# Patient Record
Sex: Male | Born: 1940 | Race: Black or African American | Hispanic: No | Marital: Single
Health system: Southern US, Community
[De-identification: ages and names within clinical notes are randomized; demographics above are authoritative.]

## PROBLEM LIST (undated history)

## (undated) DIAGNOSIS — I10 Essential (primary) hypertension: Secondary | ICD-10-CM

## (undated) DIAGNOSIS — K219 Gastro-esophageal reflux disease without esophagitis: Secondary | ICD-10-CM

## (undated) DIAGNOSIS — F411 Generalized anxiety disorder: Secondary | ICD-10-CM

## (undated) DIAGNOSIS — C61 Malignant neoplasm of prostate: Secondary | ICD-10-CM

## (undated) DIAGNOSIS — K5900 Constipation, unspecified: Secondary | ICD-10-CM

## (undated) DIAGNOSIS — E119 Type 2 diabetes mellitus without complications: Secondary | ICD-10-CM

## (undated) DIAGNOSIS — R338 Other retention of urine: Secondary | ICD-10-CM

## (undated) DIAGNOSIS — F329 Major depressive disorder, single episode, unspecified: Secondary | ICD-10-CM

## (undated) DIAGNOSIS — I2510 Atherosclerotic heart disease of native coronary artery without angina pectoris: Secondary | ICD-10-CM

## (undated) DIAGNOSIS — F0390 Unspecified dementia without behavioral disturbance: Secondary | ICD-10-CM

## (undated) DIAGNOSIS — N200 Calculus of kidney: Secondary | ICD-10-CM

## (undated) DIAGNOSIS — F32A Depression, unspecified: Secondary | ICD-10-CM

## (undated) HISTORY — PX: TRANSURETHRAL RESECTION OF PROSTATE: SHX73

---

## 2001-10-27 ENCOUNTER — Emergency Department (HOSPITAL_COMMUNITY): Admission: EM | Admit: 2001-10-27 | Discharge: 2001-10-27 | Payer: Self-pay | Admitting: Emergency Medicine

## 2001-10-27 ENCOUNTER — Encounter: Payer: Self-pay | Admitting: Emergency Medicine

## 2001-11-18 ENCOUNTER — Encounter: Payer: Self-pay | Admitting: Emergency Medicine

## 2001-11-18 ENCOUNTER — Emergency Department (HOSPITAL_COMMUNITY): Admission: EM | Admit: 2001-11-18 | Discharge: 2001-11-18 | Payer: Self-pay | Admitting: Emergency Medicine

## 2002-01-22 ENCOUNTER — Ambulatory Visit (HOSPITAL_COMMUNITY): Admission: RE | Admit: 2002-01-22 | Discharge: 2002-01-23 | Payer: Self-pay | Admitting: Ophthalmology

## 2002-04-13 ENCOUNTER — Emergency Department (HOSPITAL_COMMUNITY): Admission: EM | Admit: 2002-04-13 | Discharge: 2002-04-13 | Payer: Self-pay | Admitting: Emergency Medicine

## 2002-04-14 ENCOUNTER — Encounter: Payer: Self-pay | Admitting: Emergency Medicine

## 2002-04-14 ENCOUNTER — Inpatient Hospital Stay (HOSPITAL_COMMUNITY): Admission: EM | Admit: 2002-04-14 | Discharge: 2002-04-17 | Payer: Self-pay | Admitting: Emergency Medicine

## 2002-08-06 ENCOUNTER — Ambulatory Visit (HOSPITAL_COMMUNITY): Admission: RE | Admit: 2002-08-06 | Discharge: 2002-08-06 | Payer: Self-pay | Admitting: Internal Medicine

## 2002-08-06 ENCOUNTER — Encounter: Payer: Self-pay | Admitting: Internal Medicine

## 2004-11-25 ENCOUNTER — Inpatient Hospital Stay (HOSPITAL_COMMUNITY): Admission: AD | Admit: 2004-11-25 | Discharge: 2004-12-03 | Payer: Self-pay | Admitting: Internal Medicine

## 2005-03-21 ENCOUNTER — Ambulatory Visit (HOSPITAL_COMMUNITY): Admission: RE | Admit: 2005-03-21 | Discharge: 2005-03-21 | Payer: Self-pay | Admitting: Internal Medicine

## 2005-07-15 ENCOUNTER — Ambulatory Visit (HOSPITAL_COMMUNITY): Admission: RE | Admit: 2005-07-15 | Discharge: 2005-07-15 | Payer: Self-pay | Admitting: Internal Medicine

## 2005-07-21 ENCOUNTER — Emergency Department (HOSPITAL_COMMUNITY): Admission: EM | Admit: 2005-07-21 | Discharge: 2005-07-22 | Payer: Self-pay | Admitting: Emergency Medicine

## 2005-07-28 ENCOUNTER — Emergency Department (HOSPITAL_COMMUNITY): Admission: EM | Admit: 2005-07-28 | Discharge: 2005-07-29 | Payer: Self-pay | Admitting: Emergency Medicine

## 2005-09-12 ENCOUNTER — Emergency Department (HOSPITAL_COMMUNITY): Admission: EM | Admit: 2005-09-12 | Discharge: 2005-09-12 | Payer: Self-pay | Admitting: Emergency Medicine

## 2005-11-04 ENCOUNTER — Ambulatory Visit (HOSPITAL_COMMUNITY): Admission: RE | Admit: 2005-11-04 | Discharge: 2005-11-04 | Payer: Self-pay | Admitting: Urology

## 2006-08-17 ENCOUNTER — Ambulatory Visit (HOSPITAL_COMMUNITY): Admission: RE | Admit: 2006-08-17 | Discharge: 2006-08-17 | Payer: Self-pay | Admitting: Internal Medicine

## 2006-08-21 ENCOUNTER — Ambulatory Visit (HOSPITAL_COMMUNITY): Admission: RE | Admit: 2006-08-21 | Discharge: 2006-08-21 | Payer: Self-pay | Admitting: Internal Medicine

## 2006-12-07 ENCOUNTER — Emergency Department (HOSPITAL_COMMUNITY): Admission: EM | Admit: 2006-12-07 | Discharge: 2006-12-07 | Payer: Self-pay | Admitting: Emergency Medicine

## 2006-12-07 ENCOUNTER — Ambulatory Visit (HOSPITAL_COMMUNITY): Admission: RE | Admit: 2006-12-07 | Discharge: 2006-12-07 | Payer: Self-pay | Admitting: Internal Medicine

## 2006-12-13 ENCOUNTER — Ambulatory Visit: Payer: Self-pay | Admitting: Orthopedic Surgery

## 2006-12-25 ENCOUNTER — Ambulatory Visit: Payer: Self-pay | Admitting: Orthopedic Surgery

## 2007-01-04 ENCOUNTER — Ambulatory Visit: Payer: Self-pay | Admitting: Orthopedic Surgery

## 2007-02-05 ENCOUNTER — Ambulatory Visit: Payer: Self-pay | Admitting: Orthopedic Surgery

## 2007-02-05 DIAGNOSIS — S42309A Unspecified fracture of shaft of humerus, unspecified arm, initial encounter for closed fracture: Secondary | ICD-10-CM

## 2007-04-08 ENCOUNTER — Emergency Department (HOSPITAL_COMMUNITY): Admission: EM | Admit: 2007-04-08 | Discharge: 2007-04-08 | Payer: Self-pay | Admitting: Emergency Medicine

## 2007-04-19 ENCOUNTER — Ambulatory Visit: Payer: Self-pay | Admitting: Orthopedic Surgery

## 2007-04-19 DIAGNOSIS — S5290XA Unspecified fracture of unspecified forearm, initial encounter for closed fracture: Secondary | ICD-10-CM | POA: Insufficient documentation

## 2007-04-30 ENCOUNTER — Encounter: Payer: Self-pay | Admitting: Orthopedic Surgery

## 2007-05-04 ENCOUNTER — Encounter: Payer: Self-pay | Admitting: Orthopedic Surgery

## 2007-05-18 ENCOUNTER — Telehealth: Payer: Self-pay | Admitting: Orthopedic Surgery

## 2007-07-19 ENCOUNTER — Emergency Department (HOSPITAL_COMMUNITY): Admission: EM | Admit: 2007-07-19 | Discharge: 2007-07-19 | Payer: Self-pay | Admitting: Emergency Medicine

## 2010-03-08 ENCOUNTER — Emergency Department (HOSPITAL_COMMUNITY): Admission: EM | Admit: 2010-03-08 | Discharge: 2010-03-08 | Payer: Self-pay | Admitting: Emergency Medicine

## 2010-03-14 ENCOUNTER — Emergency Department (HOSPITAL_COMMUNITY)
Admission: EM | Admit: 2010-03-14 | Discharge: 2010-03-14 | Payer: Self-pay | Source: Home / Self Care | Admitting: Emergency Medicine

## 2010-03-18 ENCOUNTER — Emergency Department (HOSPITAL_COMMUNITY)
Admission: EM | Admit: 2010-03-18 | Discharge: 2010-03-18 | Payer: Self-pay | Source: Home / Self Care | Admitting: Emergency Medicine

## 2010-04-10 ENCOUNTER — Emergency Department (HOSPITAL_COMMUNITY)
Admission: EM | Admit: 2010-04-10 | Discharge: 2010-04-10 | Payer: Self-pay | Source: Home / Self Care | Admitting: Emergency Medicine

## 2010-04-20 ENCOUNTER — Encounter (INDEPENDENT_AMBULATORY_CARE_PROVIDER_SITE_OTHER): Payer: Self-pay | Admitting: Urology

## 2010-04-20 ENCOUNTER — Inpatient Hospital Stay (HOSPITAL_COMMUNITY)
Admission: RE | Admit: 2010-04-20 | Discharge: 2010-04-21 | Payer: Self-pay | Source: Home / Self Care | Attending: Urology | Admitting: Urology

## 2010-04-26 LAB — CBC
HCT: 39.4 % (ref 39.0–52.0)
HCT: 37.7 % — ABNORMAL LOW (ref 39.0–52.0)
Hemoglobin: 13 g/dL (ref 13.0–17.0)
Hemoglobin: 12.6 g/dL — ABNORMAL LOW (ref 13.0–17.0)
MCH: 31.6 pg (ref 26.0–34.0)
MCH: 31.6 pg (ref 26.0–34.0)
MCHC: 33 g/dL (ref 30.0–36.0)
MCHC: 33.4 g/dL (ref 30.0–36.0)
MCV: 95.6 fL (ref 78.0–100.0)
MCV: 94.5 fL (ref 78.0–100.0)
Platelets: 275 10*3/uL (ref 150–400)
Platelets: 257 10*3/uL (ref 150–400)
RBC: 4.12 MIL/uL — ABNORMAL LOW (ref 4.22–5.81)
RBC: 3.99 MIL/uL — ABNORMAL LOW (ref 4.22–5.81)
RDW: 14.4 % (ref 11.5–15.5)
RDW: 14.3 % (ref 11.5–15.5)
WBC: 7.1 10*3/uL (ref 4.0–10.5)
WBC: 11.5 10*3/uL — ABNORMAL HIGH (ref 4.0–10.5)

## 2010-04-26 LAB — BASIC METABOLIC PANEL
BUN: 16 mg/dL (ref 6–23)
BUN: 11 mg/dL (ref 6–23)
CO2: 29 mEq/L (ref 19–32)
CO2: 29 mEq/L (ref 19–32)
Calcium: 9.9 mg/dL (ref 8.4–10.5)
Calcium: 9 mg/dL (ref 8.4–10.5)
Chloride: 100 mEq/L (ref 96–112)
Chloride: 103 mEq/L (ref 96–112)
Creatinine, Ser: 1.12 mg/dL (ref 0.4–1.5)
Creatinine, Ser: 0.98 mg/dL (ref 0.4–1.5)
GFR calc Af Amer: >60 mL/min (ref >60)
GFR calc Af Amer: >60 mL/min (ref >60)
GFR calc non Af Amer: >60 mL/min (ref >60)
GFR calc non Af Amer: >60 mL/min (ref >60)
Glucose, Bld: 151 mg/dL — ABNORMAL HIGH (ref 70–99)
Glucose, Bld: 178 mg/dL — ABNORMAL HIGH (ref 70–99)
Potassium: 4.7 mEq/L (ref 3.5–5.1)
Potassium: 3.8 mEq/L (ref 3.5–5.1)
Sodium: 139 mEq/L (ref 135–145)
Sodium: 139 mEq/L (ref 135–145)

## 2010-04-26 LAB — DIFFERENTIAL
Basophils Absolute: 0 10*3/uL (ref 0.0–0.1)
Basophils Relative: 0 % (ref 0–1)
Eosinophils Absolute: 0.1 10*3/uL (ref 0.0–0.7)
Eosinophils Relative: 1 % (ref 0–5)
Lymphocytes Relative: 16 % (ref 12–46)
Lymphs Abs: 1.9 10*3/uL (ref 0.7–4.0)
Monocytes Absolute: 1.6 10*3/uL — ABNORMAL HIGH (ref 0.1–1.0)
Monocytes Relative: 14 % — ABNORMAL HIGH (ref 3–12)
Neutro Abs: 8 10*3/uL — ABNORMAL HIGH (ref 1.7–7.7)
Neutrophils Relative %: 69 % (ref 43–77)

## 2010-04-26 LAB — GLUCOSE, CAPILLARY
Glucose-Capillary: 92 mg/dL (ref 70–99)
Glucose-Capillary: 217 mg/dL — ABNORMAL HIGH (ref 70–99)
Glucose-Capillary: 186 mg/dL — ABNORMAL HIGH (ref 70–99)
Glucose-Capillary: 234 mg/dL — ABNORMAL HIGH (ref 70–99)
Glucose-Capillary: 255 mg/dL — ABNORMAL HIGH (ref 70–99)
Glucose-Capillary: 88 mg/dL (ref 70–99)

## 2010-04-26 LAB — SURGICAL PCR SCREEN: Staphylococcus aureus: POSITIVE — AB

## 2010-04-28 ENCOUNTER — Emergency Department (HOSPITAL_COMMUNITY)
Admission: EM | Admit: 2010-04-28 | Discharge: 2010-04-28 | Payer: Self-pay | Source: Home / Self Care | Admitting: Emergency Medicine

## 2010-04-30 ENCOUNTER — Inpatient Hospital Stay (HOSPITAL_COMMUNITY)
Admission: EM | Admit: 2010-04-30 | Discharge: 2010-05-06 | Disposition: A | Discharge status: Other Acute Inpatient Hospital | Payer: Self-pay | Source: Home / Self Care | Attending: Internal Medicine | Admitting: Internal Medicine

## 2010-05-03 LAB — CBC
HCT: 35.2 % — ABNORMAL LOW (ref 39.0–52.0)
MCH: 31.5 pg (ref 26.0–34.0)
MCV: 94.9 fL (ref 78.0–100.0)
RDW: 14.5 % (ref 11.5–15.5)
WBC: 10.4 10*3/uL (ref 4.0–10.5)

## 2010-05-03 LAB — CARDIAC PANEL(CRET KIN+CKTOT+MB+TROPI)
CK, MB: 1.1 ng/mL (ref 0.3–4.0)
Total CK: 63 U/L (ref 7–232)

## 2010-05-03 LAB — DIFFERENTIAL
Basophils Relative: 0 % (ref 0–1)
Eosinophils Relative: 4 % (ref 0–5)
Lymphs Abs: 1.8 10*3/uL (ref 0.7–4.0)
Monocytes Relative: 9 % (ref 3–12)
Neutro Abs: 7.3 10*3/uL (ref 1.7–7.7)

## 2010-05-03 LAB — POCT CARDIAC MARKERS: Myoglobin, poc: 85.4 ng/mL (ref 12–200)

## 2010-05-03 LAB — COMPREHENSIVE METABOLIC PANEL
Alkaline Phosphatase: 63 U/L (ref 39–117)
BUN: 15 mg/dL (ref 6–23)
Glucose, Bld: 229 mg/dL — ABNORMAL HIGH (ref 70–99)
Potassium: 4.5 mEq/L (ref 3.5–5.1)
Total Bilirubin: 0.2 mg/dL — ABNORMAL LOW (ref 0.3–1.2)
Total Protein: 6.8 g/dL (ref 6.0–8.3)

## 2010-05-03 NOTE — H&P (Signed)
[name], [name]            ACCOUNT NO.:  [id number]  MEDICAL RECORD NO.:  [id number]          PATIENT TYPE:  INP  LOCATION:  A335                          FACILITY:  [facility]  PHYSICIAN:  Garnett Rekowski D. Felecia Shelling, MD   DATE OF BIRTH:  05/02/1940  DATE OF ADMISSION:  04/30/2010 DATE OF DISCHARGE:  LH                             HISTORY & PHYSICAL   CHIEF COMPLAINT:  Chest pain.  HISTORY OF PRESENT ILLNESS:  This is a 70 year old male patient with history of multiple medical illnesses, who is currently a resident of local rest home, brought to emergency room due to chest pain.  He has been complaining of intermittent episode of chest pain for 2-3 days. His chest pain was intermittent and it was on midsternal area.  The patient was evaluated in the emergency room and his initial EKG and cardiac enzymes were negative for any acute myocardial ischemia.  His chest pain was relieved during the emergency room evaluation.  However, due to his multiple risk factors, the patient was admitted for further evaluation and monitoring.  REVIEW OF SYSTEMS:  The patient has multi-infarct dementia.  He is a very poor historian.  However, he has no fever or chills, nausea, vomiting, abdominal pain, dysuria, urgency, or frequency of urination.  PAST MEDICAL HISTORY: 1. Diabetes mellitus type 2. 2. History of multiple CVA with left-sided hemiplegia. 3. Diabetes neuropathy. 4. Anemia. 5. Hypertension. 6. Multi-infarct dementia. 7. History of GI bleed. 8. History of abscess and cellulitis of the left arm. 9. History of benign prostatic hypertrophy. 10.History of urinary retention secondary to the above.  CURRENT MEDICATIONS: 1. Actos 45 mg p.o. daily. 2. Alendronate 70 mg once weekly. 3. Xanax 0.25 mg at bedtime. 4. Amlodipine 10 mg daily. 5. Benazepril 40 mg daily. 6. Ciprofloxacin 250 mg b.i.d. 7. Ferrous sulfate 325 mg daily. 8. Neurontin 600 mg t.i.d. 9. Hydralazine 25 mg b.i.d. 10.Levemir  10 units b.i.d. 11.Metformin 1000 mg b.i.d. 12.NovoLog insulin sliding scale. 13.Paroxetine 40 mg daily. 14.Ranitidine 150 mg b.i.d. 15.Zocor 20 mg daily.  SOCIAL HISTORY:  The patient is currently a resident of assisted living. He has no history of recent alcohol, tobacco, or substance abuse.  The patient has been disabled due to his illness for several years.  FAMILY HISTORY:  This is not available as the patient has multi-infarct dementia and is not able to give any history.  PHYSICAL EXAMINATION:  GENERAL:  The patient is alert, awake, and chronically sick looking. VITAL SIGNS:  Blood pressure 172/81, pulse 97, respiratory rate 20, and temperature 99 degrees Fahrenheit. HEENT:  Pupils are equal and reactive. NECK:  Supple. CHEST:  Decreased air entry, few rhonchi. CARDIOVASCULAR SYSTEM:  First and second heart sounds heard.  No murmur, no gallop. ABDOMEN:  Soft and lax.  Bowel sounds positive.  No mass or organomegaly. EXTREMITIES:  No leg edema. NEUROLOGIC:  The patient is alert, awake, but confused and disoriented. The patient has left-sided hemiplegia.  LABS ON ADMISSION:  CMP, sodium 138, potassium 4.5, chloride 99, carbon dioxide 32, glucose 229, BUN 15, creatinine 1.3, and calcium 8.5.  CBC, WBC 10.4, hemoglobin 11.4, hematocrit 35.2,  and platelets 104-137.  D- dimer is 0.51, myoglobin 90, troponin less than 0.05, and CK-MB 1.0.  ASSESSMENT: 1. Chest pain to rule out acute myocardial ischemia. 2. Diabetes mellitus. 3. History of multiple cerebrovascular accident with left-sided     hemiplegia. 4. Multi-infarct dementia. 5. Neuropathy. 6. Hypertension. 7. History of acute urinary retention secondary to benign prostatic     hypertrophy. 8. History of anemia.  PLAN:  We will continue on telemetry and serial cardiac enzymes and EKG q.8 h. x3.  We will continue his regular medications.  We will continue supportive care.  And we will do an echocardiogram and  Cardiology consult.     Lamount Bankson D. Felecia Shelling, MD     TDF/MEDQ  D:  05/01/2010  T:  05/01/2010  Job:  [id number]  Electronically Signed by Avon Gully MD on 05/03/2010 08:37:15 AM

## 2010-05-04 LAB — URINE MICROSCOPIC-ADD ON

## 2010-05-04 LAB — BASIC METABOLIC PANEL
BUN: 16 mg/dL (ref 6–23)
CO2: 27 mEq/L (ref 19–32)
Calcium: 8.5 mg/dL (ref 8.4–10.5)
Creatinine, Ser: 1.02 mg/dL (ref 0.4–1.5)
GFR calc non Af Amer: >60 mL/min (ref >60)
Glucose, Bld: 136 mg/dL — ABNORMAL HIGH (ref 70–99)

## 2010-05-04 LAB — DIFFERENTIAL
Eosinophils Absolute: 0.4 10*3/uL (ref 0.0–0.7)
Eosinophils Relative: 4 % (ref 0–5)
Lymphs Abs: 1.9 10*3/uL (ref 0.7–4.0)
Monocytes Relative: 10 % (ref 3–12)
Neutrophils Relative %: 67 % (ref 43–77)

## 2010-05-04 LAB — GLUCOSE, CAPILLARY
Glucose-Capillary: 234 mg/dL — ABNORMAL HIGH (ref 70–99)
Glucose-Capillary: 147 mg/dL — ABNORMAL HIGH (ref 70–99)
Glucose-Capillary: 127 mg/dL — ABNORMAL HIGH (ref 70–99)
Glucose-Capillary: 145 mg/dL — ABNORMAL HIGH (ref 70–99)
Glucose-Capillary: 149 mg/dL — ABNORMAL HIGH (ref 70–99)
Glucose-Capillary: 112 mg/dL — ABNORMAL HIGH (ref 70–99)
Glucose-Capillary: 284 mg/dL — ABNORMAL HIGH (ref 70–99)

## 2010-05-04 LAB — CBC
HCT: 30.7 % — ABNORMAL LOW (ref 39.0–52.0)
MCH: 31.1 pg (ref 26.0–34.0)
MCV: 94.5 fL (ref 78.0–100.0)
Platelets: 365 10*3/uL (ref 150–400)
RBC: 3.25 MIL/uL — ABNORMAL LOW (ref 4.22–5.81)

## 2010-05-04 LAB — CARDIAC PANEL(CRET KIN+CKTOT+MB+TROPI): Total CK: 54 U/L (ref 7–232)

## 2010-05-04 LAB — URINALYSIS, ROUTINE W REFLEX MICROSCOPIC
Specific Gravity, Urine: 1.01 (ref 1.005–1.030)
Urine Glucose, Fasting: NEGATIVE mg/dL

## 2010-05-05 LAB — BASIC METABOLIC PANEL
BUN: 23 mg/dL (ref 6–23)
CO2: 26 mEq/L (ref 19–32)
Calcium: 9.3 mg/dL (ref 8.4–10.5)
Glucose, Bld: 130 mg/dL — ABNORMAL HIGH (ref 70–99)
Sodium: 140 mEq/L (ref 135–145)

## 2010-05-05 LAB — GLUCOSE, CAPILLARY
Glucose-Capillary: 76 mg/dL (ref 70–99)
Glucose-Capillary: 137 mg/dL — ABNORMAL HIGH (ref 70–99)
Glucose-Capillary: 145 mg/dL — ABNORMAL HIGH (ref 70–99)
Glucose-Capillary: 98 mg/dL (ref 70–99)

## 2010-05-05 LAB — LIPID PANEL
Cholesterol: 111 mg/dL (ref 0–200)
LDL Cholesterol: 68 mg/dL (ref 0–99)
Total CHOL/HDL Ratio: 3.8 RATIO
Triglycerides: 70 mg/dL (ref <150)

## 2010-05-06 NOTE — Op Note (Addendum)
  [name], [name]            ACCOUNT NO.:  [id number]  MEDICAL RECORD NO.:  [id number]          PATIENT TYPE:  INP  LOCATION:  A214                          FACILITY:  [facility]  PHYSICIAN:  Ky Barban, M.D.DATE OF BIRTH:  Aug 11, 1940  DATE OF PROCEDURE: DATE OF DISCHARGE:                              OPERATIVE REPORT   PREOPERATIVE DIAGNOSES: 1. Acute urinary retention. 2. Benign prostatic hypertrophy.  POSTOPERATIVE DIAGNOSES: 1. Acute urinary retention. 2. Benign prostatic hypertrophy.  PROCEDURE:  TUR prostate.  ANESTHESIA:  Spinal.  PROCEDURE IN DETAIL:  The patient under spinal anesthesia in lithotomy position, usual prep and drape, #28 Iglesias resectoscope was introduced into the bladder.  Prostatic urethra is completely obstructed with lateral lobe hypertrophy.  He has more tissue in the lateral lobe than what I suspected on the cystoscopy.  Bladder neck was circumferentially dissected down to the circular fibers.  Bleeders were coagulated. Resectoscope was pulled back at the level of the verumontanum, rotated to 11 o'clock position.  Resection of the right lobe was done between 11 and 7 o'clock position.  Similarly, the left lobe was resected between 1 and 5 o'clock position.  Next, the anterior midline tissue was resected. At the end, posterior midline tissue was resected along with the apical tissue with finger in the rectum to elevate the prostatic tissue.  The prostatic urethra looks wide open.  Chips were evacuated.  Bleeders were coagulated.  Resectoscope was removed, 22 three-way Foley catheter was left in for drainage.  CVA is clear.  The patient left the operating room in satisfactory condition.     Ky Barban, M.D.     MIJ/MEDQ  D:  04/20/2010  T:  04/21/2010  Job:  [id number]  Electronically Signed by Alleen Borne M.D. on 05/06/2010 04:53:39 PM

## 2010-05-06 NOTE — H&P (Addendum)
  [name], [name]            ACCOUNT NO.:  [id number]  MEDICAL RECORD NO.:  [id number]         PATIENT TYPE:  PAMB  LOCATION:  DAY                           FACILITY:  [facility]  PHYSICIAN:  Ky Barban, M.D.DATE OF BIRTH:  25-Jan-1941  DATE OF ADMISSION: DATE OF DISCHARGE:  LH                             HISTORY & PHYSICAL   CHIEF COMPLAINT:  Acute urinary retention.  HISTORY:  A 70 year old male who suffers from dementia unable to give me good history, but he is in urinary retention and could not do CMG. Cystoscopy shows that he has enlarged prostate with bladder neck obstruction.  I have advised him to undergo TUR prostate, then I had meeting with his family, told them that I cannot guarantee he may just have to live with this catheter, but he was voiding satisfactorily before this thing happened and I think he may be able to void after I do a TUR prostate, so they are willing to permit to try.  He is coming as outpatient to undergo TUR prostate, will be admitted in the hospital. He has been treated in the past with Avodart and Flomax and I am following him away from 2007.  He does have enlarged prostate and his other problems include he had history of CVA with the right hemiparesis, it happened twice over 10 years ago, also has hypertension, and insulin- dependent diabetes.  No history of have any surgeries.  PERSONAL HISTORY:  He does not smoke or drink.  FAMILY HISTORY:  No history of prostate cancer.  REVIEW OF SYSTEMS:  Unremarkable.  PHYSICAL EXAMINATION:  VITAL SIGNS:  Blood pressure is 120/80, temperature is normal, lying in the bed without any complaints. CENTRAL NERVOUS SYSTEM:  He has weakness of his right side. CHEST:  Clear. HEART:  Regular sinus rhythm. ABDOMEN:  Soft, flat.  Liver, spleen, and kidneys not palpable.  No CVA tenderness. EXTERNAL GENITALIA:  Circumcised, meatus adequate, has Foley catheter in place.  Testicles are normal. RECTAL:   Sphincter tone is normal.  No rectal mass.  Prostate 1/2+ smooth and firm.  IMPRESSION: 1. Benign prostatic hypertrophy, urinary retention. 2. Dementia.  PLAN:  TUR prostate under anesthesia as outpatient, then admit him in the hospital.     Ky Barban, M.D.     MIJ/MEDQ  D:  04/19/2010  T:  04/20/2010  Job:  [id number]  Electronically Signed by Alleen Borne M.D. on 05/06/2010 04:53:35 PM

## 2010-05-11 NOTE — Assessment & Plan Note (Signed)
Summary: XR HUMERUS/BSF    History of Present Illness: I saw [name] in the office today for a 1 month followup visit.  He is a 70 years old man with the complaint of:  fracture of right proximal humeral shaft. Patient states they he feels better.  fracture 12-05-06  now in fracture brace    Prior Medications :  None    Current Allergies (reviewed today): No known allergies  Updated/Current Medications (including changes made in today's visit):  * PREVACID 30 MG  * PLAVIX 75 MG  * PAXIL 30 MG  * NORVASC 10 MG  * HUMULIN 70/30  * TYLENOL 500 MG  * GLYCOLAX POWDER  * BENADRYL  * REGLAN 10 MG  * GLUCOPHAGE 1000 MG  * NEUROTIN 600  * LASIX 20 MG  * FLOMAX  * FERROUS SULFATE  * CARDURA  * DETROL LA  * LOTENSIN  * AVANDIA 4 MG  * AMBIEN 10 MG  * ASA 355 MG        Physical Exam  xrays today show thaat the frcature is healing in acceptable non anatomic alignment     Impression & Recommendations:  Problem # 1:  FX CLOSED HUMERUS SHAFT (ICD-812.21) Assessment: Improved  Orders: Post-Op Check (16109) Shoulder x-ray,  minimum 2 views (60454)    Patient Instructions: 1)  Please schedule a follow-up appointment in 2 months. 2)  xrays shoulder  3)  and humerus     ]

## 2010-05-17 NOTE — Consult Note (Addendum)
[name], [name]            ACCOUNT NO.:  [id number]  MEDICAL RECORD NO.:  [id number]          PATIENT TYPE:  INP  LOCATION:  A335                          FACILITY:  [facility]  PHYSICIAN:  Gerrit Friends. Dietrich Pates, MD, FACCDATE OF BIRTH:  03-Feb-1941  DATE OF CONSULTATION:  05/03/2010 DATE OF DISCHARGE:                                CONSULTATION   REASON FOR CONSULTATION:  Chest pain.  DATE OF CONSULTATION:  May 03, 2010.  HISTORY OF PRESENT ILLNESS:  This is a 70 year old African American male without prior documented history of CAD but multiple cardiovascular risk factors to include diabetes, hypertension, unknown lipid status who was recently admitted on April 20, 2010, for TURP in the setting of urinary retention and BPH.  The patient has dementia and is a difficult historian, but his sister who is at bedside is assisting with recent history.  Apparently, the patient has been having chest pain on and off times 1 week, and began to have diaphoresis and hypertension at [facility].  The patient states that the pain is squeezing pressure, coming and going.  His sister states that when she checks on him, he occasionally says that his chest is hurting and at other times it is not when she visited him most recently.    On the day of admission,the patient was very diaphoretic complaining of  chest pain.  The nursesthere stated that his blood pressure was very elevated  and therefore hewas sent [facility].  On ER evaluation, the patient's blood pressure was 154/68 with a heart rate of 85.  According to nursing home notes, he had a blood glucose of 335.  On ER evaluation, blood glucose was 229.  The patient has treated with subcutaneous insulin per sliding scale.  He continues on his p.o. metformin and Actos.  The patient is currently pain free and has had no recurrence of chest pain since admission.  REVIEW OF SYSTEMS:  Although limited  secondary to the patient's dementia, sweating, and chest pain which he describes as pressure and squeezing.  All other systems are reviewed and found to be negative with limitations as discussed code status is a full code.  PAST MEDICAL HISTORY:  Diabetes type 2, multiple CVAs with left-sided hemiplegia, diabetic neuropathy, anemia, hypertension, dementia, GI bleed, abscess, and cellulitis of the left arm.  BPH with urinary retention.  Hypercholesterolemia.  PAST SURGICAL HISTORY:  Status post TURP on April 20, 2010.  SOCIAL HISTORY:  He lives in [place] at [facility].  He is retired.  He does not smoke, drink, or use drugs.  FAMILY HISTORY:  Mother deceased with cerebral aneurysm.  Father deceased from "old age."  He has a sister who is in good health.  MEDICATIONS:  Current medications prior to admission, Actos 45 mg daily, alendronate 70 mg weekly, Xanax 0.25 mg at bedtime, amlodipine 10 mg daily, benazepril 40 mg daily, ciprofloxacin 250 mg b.i.d., ferrous sulfate daily, neurotomy 600 mg t.i.d., hydralazine 25 mg b.i.d., Levemir 10 units b.i.d., metformin 1000 mg b.i.d., NovoLog insulin per sliding scale, paroxetine 40 mg daily, ranitidine 150  mg b.i.d., and Zocor 20 mg daily.  ALLERGIES:  No known drug allergies.  LABORATORY DATA:  Hemoglobin 10.1, hematocrit 30.7, white blood cells 10.0, platelets 365.  Sodium 138, potassium 4.2, chloride 104, CO2 27, BUN 16, creatinine 1.0, glucose 136, D-dimer 0.51, troponin 0.03, 0.02, and 0.02 respectively.  Urinalysis positive for protein, blood, and yeast.  Chest x-ray dated April 30, 2010, bibasilar atelectasis with enlargement of cardiac silhouette.  EKG normal sinus rhythm with PVCs rate of 90 beats per minute with nonspecific inferior septal flattening.  PHYSICAL EXAMINATION:  VITAL SIGNS:  Blood pressure 118/76, pulse 58, respirations 18, temperature 97.5, current weight 79.4 kg (175 pounds), O2  sat 94% on room air. GENERAL:  He is sleeping but arouses but then falls asleep again.  He is answering questions clearly. HEENT:  Head is normocephalic and atraumatic. EYES:  PERRLA. NECK:  Supple without JVD, carotid bruits, or thyromegaly. CARDIOVASCULAR:  Regular rate and rhythm with 1/6 systolic murmur at the apex.  Pulses are 2+ and equal without bruits. LUNGS:  Clear to auscultation without wheezes, rales, or rhonchi. ABDOMEN:  Soft, nontender, 2+ bowel sounds. EXTREMITIES:  Without clubbing, cyanosis, or edema. MUSCULOSKELETAL:  No joint deformity. NEURO:  He has chronic right-sided weakness of his legs and arm.  He is slow to respond.  He is not obtunded but will fall asleep easily.  IMPRESSION: 1. Chest pain described as pressure, squeezing with associated     diaphoresis, hypertension, negative cardiac enzymes, negative EKG     for acute ischemia but he does have some nonspecific changes.  Plan     echocardiogram for left ventricular function, begin enteric-coated     aspirin in the setting of gastrointestinal bleed.  Sister does not     want invasive testing or intervention (i.e. cath or stents) once    echo was completed, can manage with medications.  We will continue     ACE inhibitor. 2. Insulin-dependent diabetes per PTH. 3. Hypertension well controlled at present, on benazepril, amlodipine.     We will follow this for medication adjustments if it is necessary. 4. Anemia, we would monitor this apparently this is chronic and has     been replaced by ferrous sulfate, assuming this is iron deficiency     anemia, although I do not have documented records for this.  PLAN:  This is a 70 year old African American male without prior documented CAD who presented to [facility] for recurrent chest pain and ongoing for a week with associated diaphoresis, hyperglycemia, and hypertension.  Our plan is to manage this patient medically as the sister at bedside does not want  any invasive testing completed.  Our plan will be to have echocardiogram completed and managed medically based upon his LV function.  We will continue to follow this patient throughout hospitalization making further recommendations.  On behalf of the physicians and providers of [facility], we would like to thank Dr. Felecia Shelling for allowing [place] to assist in the care of this patient.     Bettey Mare. Lyman Bishop, NP   ______________________________ Gerrit Friends. Dietrich Pates, MD, [facility]    KML/MEDQ  D:  05/03/2010  T:  05/03/2010  Job:  [id number]  cc:   Dr. Felecia Shelling  Electronically Signed by Joni Reining NP on 05/05/2010 03:55:48 PM Electronically Signed by [place] Bing MD [facility] on 05/17/2010 08:18:00 AM

## 2010-05-26 NOTE — Discharge Summary (Signed)
NAMEDEMANI, MCBRIEN            ACCOUNT NO.:  [id number]  MEDICAL RECORD NO.:  [id number]          PATIENT TYPE:  LOCATION:                                 FACILITY:  PHYSICIAN:  Andra Matsuo D. Felecia Shelling, MD        DATE OF BIRTH:  DATE OF ADMISSION:  04/30/2010 DATE OF DISCHARGE:  01/25/2012LH                              DISCHARGE SUMMARY   DISCHARGE DIAGNOSES: 1. Chest pain. 2. Diabetes mellitus. 3. History of multiple cerebrovascular accidents. 4. Multi-infarct dementia. 5. History of coronary artery disease. 6  Diabetic neuropathy. 1. Anemia. 2. History of gastrointestinal bleed. 3. History of cellulitis of the left arm. 4. History of benign prostatic hypertrophy. 5. History of acute urinary tract infection due to bladder outlet     obstruction. 6. Status post transurethral prostatectomy. 7. Hyperlipidemia.  CURRENT MEDICATIONS: 1. Accu-Chek with sliding scale a.c. and at bedtime. 2. Actos 45 mg daily. 3. Xanax 0.25 mg at bedtime. 4. Norvasc 10 mg daily. 5. Lotensin 14 mg daily. 6. Ferrous sulfate 325 mg daily. 7. Gabapentin 300 mg 3 times a day. 8. Hydralazine 25 mg twice a day. 9. Lantus insulin 10 units subcu twice a day. 10.Loxapine 10 mg at bedtime. 11.Glucophage 500 mg twice a day. 12.Paxil 40 mg daily. 13.Pepcid 20 mg twice a day. 14.Zocor 20 mg daily. 15.Clonidine 0.1 mg twice a day. 16.Ciprofloxacin 500 mg p.o. twice a day for 3 more days. 17.Metoprolol succinate 25 mg daily. 18.Aspirin 81 mg daily. 19.Nitroglycerin 0.4 mg sublingual p.r.n. for chest pain ever 5     minutes times 3.  DISPOSITION:  The patient will be discharged to a nursing home in stable condition.  DISCHARGE INSTRUCTIONS:  The patient will be discharged with a Foley catheter.  He will need follow-up with his urologist, Dr. Onalee Hua.  And the patient will continue oral antibiotics for 3 more days.  LABS ON DISCHARGE:  CBC: WBC 10.0, hemoglobin 10.1, hematocrit 30.7 and platelets 365.   BMP:  Sodium 140, potassium 4.2, chloride 106, carbon dioxide 26, glucose 130, BUN 23, creatinine 1.0, calcium 9.1.  Lipid panel:  Cholesterol 111, triglycerides 70, LDH 29, HDL 68.  HOSPITAL COURSE:  This is a 70 year old male patient with history of multiple medical illnesses including multi-infarct dementia.  The patient was a resident of a local assisted living.  He was brought to the emergency room due to chest pain.  Patient is a very poor historian due to his multiple medical illnesses.  He was unable to give detailed history.  However, he was admitted as a case of chest pain to rule out myocardial infarction.  Serial EKGs and cardiac enzymes were negative. The patient was seen by cardiologist and advised to continue on medical treatment.  The patient has a history of recent prostate surgery due to acute urinary retention.  He has indwelling Foley catheter.  The patient is going to be discharged due to his Foley catheter.  He needs follow-up with his urologist to decide when to take out the Foley catheter.  The patient will continue his regular treatment.     Shenekia Riess D. Felecia Shelling, MD  TDF/MEDQ  D:  05/05/2010  T:  05/05/2010  Job:  [id number]  Electronically Signed by Avon Gully MD on 05/26/2010 08:18:23 AM

## 2010-06-07 NOTE — Discharge Summary (Signed)
  [name], [name]            ACCOUNT NO.:  [id number]  MEDICAL RECORD NO.:  [id number]          PATIENT TYPE:  INP  LOCATION:  A214                          FACILITY:  [facility]  PHYSICIAN:  Ky Barban, M.D.DATE OF BIRTH:  Apr 21, 1940  DATE OF ADMISSION:  04/20/2010 DATE OF DISCHARGE:  01/11/2012LH                              DISCHARGE SUMMARY   A 70 year old gentleman who has multiple medical problems including dementia and not able to give me very good history.  He is in urinary retention.  We could not do a systematic because he is not able to tell me anything.  Cystoscopy was done in the office, showed enlarged prostate with bladder neck obstruction, so I decided to do a TUR of prostate.  I have told the family that he may not be fairly able to void, but we need to try to do this thing.  He was brought as outpatient after having routine admission workup, which was satisfactory.  He was taken to the operating room, TUR of prostate is done.  Postoperatively next day, his urine is basically clear.  It was slightly pinkish, so I decided to send him home with Foley catheter, which I will take it out in the office next week.  His final pathology report is still pending and I will find out when he comes back to the office for follow up.  FINAL DISCHARGE DIAGNOSES: 1. Benign prostatic hypertrophy with bladder neck obstruction. 2. Dementia.  DISCHARGE CONDITION:  Improved.  DISCHARGE MEDICATIONS:  He is advised to continue his usual medicine, I do not need to give him anything.  Report to the office in 1 week.     Ky Barban, M.D.     MIJ/MEDQ  D:  06/02/2010  T:  06/03/2010  Job:  [id number]  Electronically Signed by Alleen Borne M.D. on 06/07/2010 09:36:11 AM

## 2010-06-21 LAB — URINE CULTURE
Colony Count: 75000
Colony Count: 3000
Colony Count: NO GROWTH
Culture: NO GROWTH

## 2010-06-21 LAB — CBC
HCT: 34.7 % — ABNORMAL LOW (ref 39.0–52.0)
Hemoglobin: 11.9 g/dL — ABNORMAL LOW (ref 13.0–17.0)
MCHC: 33.3 g/dL (ref 30.0–36.0)
Platelets: 314 10*3/uL (ref 150–400)
RDW: 13.8 % (ref 11.5–15.5)
RDW: 13.9 % (ref 11.5–15.5)
WBC: 7 10*3/uL (ref 4.0–10.5)

## 2010-06-21 LAB — URINALYSIS, ROUTINE W REFLEX MICROSCOPIC
Bilirubin Urine: NEGATIVE
Glucose, UA: 100 mg/dL — AB
Ketones, ur: 15 mg/dL — AB
Nitrite: POSITIVE — AB
Protein, ur: 30 mg/dL — AB
Protein, ur: >300 mg/dL — AB
Specific Gravity, Urine: 1.025 (ref 1.005–1.030)
Urobilinogen, UA: 0.2 mg/dL (ref 0.0–1.0)
Urobilinogen, UA: 1 mg/dL (ref 0.0–1.0)

## 2010-06-21 LAB — DIFFERENTIAL
Basophils Absolute: 0 10*3/uL (ref 0.0–0.1)
Basophils Absolute: 0 10*3/uL (ref 0.0–0.1)
Basophils Relative: 1 % (ref 0–1)
Lymphocytes Relative: 17 % (ref 12–46)
Monocytes Absolute: 1 10*3/uL (ref 0.1–1.0)
Neutro Abs: 4.8 10*3/uL (ref 1.7–7.7)
Neutro Abs: 6 10*3/uL (ref 1.7–7.7)
Neutrophils Relative %: 68 % (ref 43–77)
Neutrophils Relative %: 71 % (ref 43–77)

## 2010-06-21 LAB — BASIC METABOLIC PANEL
BUN: 15 mg/dL (ref 6–23)
BUN: 17 mg/dL (ref 6–23)
Calcium: 9.4 mg/dL (ref 8.4–10.5)
Calcium: 9.8 mg/dL (ref 8.4–10.5)
GFR calc non Af Amer: 51 mL/min — ABNORMAL LOW (ref >60)
GFR calc non Af Amer: >60 mL/min (ref >60)
Glucose, Bld: 110 mg/dL — ABNORMAL HIGH (ref 70–99)
Potassium: 4.2 mEq/L (ref 3.5–5.1)
Sodium: 137 mEq/L (ref 135–145)
Sodium: 141 mEq/L (ref 135–145)

## 2010-06-21 LAB — URINE MICROSCOPIC-ADD ON

## 2010-06-22 LAB — URINALYSIS, ROUTINE W REFLEX MICROSCOPIC
Bilirubin Urine: NEGATIVE
Leukocytes, UA: NEGATIVE
Nitrite: NEGATIVE
Specific Gravity, Urine: 1.01 (ref 1.005–1.030)
Urobilinogen, UA: 0.2 mg/dL (ref 0.0–1.0)
pH: 6.5 (ref 5.0–8.0)

## 2010-06-22 LAB — URINE MICROSCOPIC-ADD ON

## 2010-06-22 LAB — DIFFERENTIAL
Eosinophils Absolute: 0 10*3/uL (ref 0.0–0.7)
Eosinophils Relative: 0 % (ref 0–5)
Lymphocytes Relative: 7 % — ABNORMAL LOW (ref 12–46)
Lymphs Abs: 0.7 10*3/uL (ref 0.7–4.0)
Monocytes Relative: 7 % (ref 3–12)

## 2010-06-22 LAB — BASIC METABOLIC PANEL
CO2: 26 mEq/L (ref 19–32)
Chloride: 106 mEq/L (ref 96–112)
GFR calc Af Amer: >60 mL/min (ref >60)
Potassium: 4.4 mEq/L (ref 3.5–5.1)
Sodium: 141 mEq/L (ref 135–145)

## 2010-06-22 LAB — URINE CULTURE

## 2010-06-22 LAB — CBC
Hemoglobin: 11.8 g/dL — ABNORMAL LOW (ref 13.0–17.0)
MCH: 31.8 pg (ref 26.0–34.0)
MCV: 96 fL (ref 78.0–100.0)
Platelets: 276 10*3/uL (ref 150–400)
RBC: 3.71 MIL/uL — ABNORMAL LOW (ref 4.22–5.81)
WBC: 11 10*3/uL — ABNORMAL HIGH (ref 4.0–10.5)

## 2010-08-27 NOTE — H&P (Signed)
[name], [name]                        ACCOUNT NO.:  [id number]   MEDICAL RECORD NO.:  [id number]                   PATIENT TYPE:  INP   LOCATION:  A326                                 FACILITY:  [facility]   PHYSICIAN:  Tesfaye D. Felecia Shelling, M.D.              DATE OF BIRTH:  02-Sep-1940   DATE OF ADMISSION:  04/14/2002  DATE OF DISCHARGE:                                HISTORY & PHYSICAL   CHIEF COMPLAINT:  Lower abdominal pain and tarry stool.   HISTORY OF PRESENT ILLNESS:  This is a 70 year old male patient with  multiple medical illnesses from [facility], brought to the  emergency room with above complaint.  The patient claims he had intermittent  lower abdominal pain and tarry stools since two days.  He was brought to the  emergency room yesterday and was found to have a tarry stool which was heme-  positive and had hemoglobin of 10.1 and hematocrit of 31.5.  The patient was  evaluated and he was advised for admission.  However, the patient decided to  go back to the nursing home.  This morning, his symptoms continued to  persist and he decided to come back to the emergency room.  When he was  reevaluated, he was found to have persistent tarry stool with a hemoglobin  of 10.6 and hematocrit of 33.  The patient was hemodynamically otherwise  negative.  He had no nausea, vomiting, dysuria, urgency or frequency of  urination.  The patient was then admitted for further evaluation and  treatment.  The patient is also noted to be on aspirin and Plavix.   PAST MEDICAL HISTORY:  1. Diabetes mellitus, type 2.  2. Hypertension.  3. History of multiple CVAs with right-side hemiplegia.  4. Anemia.  5. Diabetic retinopathy.   CURRENT MEDICATIONS:  1. Aspirin 325 mg p.o. every day.  2. Plavix 75 mg p.o. every day.  3. Prevacid 30 mg p.o. every day.  4. Colace 100 mg p.o. b.i.d.  5. Neurontin 600 mg p.o. t.i.d.  6. Glucophage 1000 mg p.o. b.i.d.  7. Lotensin 40 mg p.o. every  day.  8. Humulin insulin 70/30 -- 80 units subcu q.p.m.  9. Glucotrol XL 10 mg p.o. every day.  10.      Lasix 20 mg p.o. every day.  11.      Cardura 1 mg p.o. every day.  12.      Avandia 4 mg p.o. every day.  13.      Ambien 10 mg p.o. q.h.s.  14.      Norvasc 10 mg p.o. every day.  15.      Paxil 30 mg p.o. every day.  16.      Tylenol 500 mg two tablets p.o. every day.   PERSONAL AND SOCIAL HISTORY:  The patient is a resident of [facility]  He is disabled due to  his illness.  He is single.  No history of  recent alcohol, tobacco or substance abuse.   PHYSICAL EXAMINATION:  GENERAL:  The patient is alert, awake and chronically  sick-looking.  VITALS:  Pulse 88, 130/80, respiratory rate 16, temperature 98 degrees  Fahrenheit.  HEENT:  Pupils are equal and reactive.  NECK:  Neck is supple.  CHEST:  Clear lung fields.  Good air entry.  CARDIOVASCULAR:  First and second heart sounds heard.  No murmur.  No  gallop.  ABDOMEN:  Abdomen is soft and relaxed.  Bowel sounds are positive.  No mass.  No organomegaly.  EXTREMITIES:  No leg edema.  NEUROLOGICAL:  The patient has slurred speech.  He had right-sided  hemiplegia.  Otherwise, he is alert, awake and oriented to 3.   LABORATORY DATA:  WBC 7.6, hemoglobin 10.6, hematocrit 33.6 and platelets  423,000; repeat hemoglobin was 9.1 and hematocrit was 28.6.  Sodium 138,  potassium 4.0, chloride 102, glucose 84, CO2 33, BUN 11, creatinine 0.9,  calcium 9.0, amylase 131 and lipase 31.   ASSESSMENT:  1. Gastrointestinal bleed.  2. Anemia secondary to the above.  3. Diabetes mellitus.  4. Cerebrovascular accident with right hemiplegia.  5. Hypertension.  6. Diabetic retinopathy and neuropathy.   PLAN:  We will do serial hemoglobin and hematocrits.  We will start the  patient on Protonix 40 mg IV push.  We will do GI consult.  We will hold his  Plavix and aspirin and we will continue his regular medications.                                                Tesfaye D. Felecia Shelling, M.D.    TDF/MEDQ  D:  04/14/2002  T:  04/15/2002  Job:  [id number]

## 2010-08-27 NOTE — Discharge Summary (Signed)
   NAMELEOBARDO, GRANLUND                        ACCOUNT NO.:  [id number]   MEDICAL RECORD NO.:  [id number]                   PATIENT TYPE:  INP   LOCATION:  A326                                 FACILITY:  [facility]   PHYSICIAN:  Tesfaye D. Felecia Shelling, M.D.              DATE OF BIRTH:  May 29, 1940   DATE OF ADMISSION:  04/14/2002  DATE OF DISCHARGE:  04/17/2002                                 DISCHARGE SUMMARY   DISCHARGE DIAGNOSES:  1. Gastrointestinal bleed, probably from erosion of small bowel.  2. Anemia secondary to the above.  3. Diabetes mellitus.  4. Hypertension.  5. Status post cerebrovascular accident.  6. Diabetic retinopathy and neuropathy.   DISPOSITION:  The patient was discharged to rest home in stable condition.   HOSPITAL COURSE:  This is a 70 year old male patient with a history of  multiple medical illnesses, admitted from [facility].  The patient  had a lower GI bleed with tarry stool.  There was a drop in his hemoglobin  and hematocrit.  He was evaluated by GI and had a colonoscopy.  The patient  was found to have very dry stool with severe sign of constipation.  There  was no obvious site of acute bleeding.  Probably his bleeding was from  erosion of the intestine due to very dry stool.  Over the hospital stay the  patient improved, and he was discharged back to the rest home with stool  softeners and regular treatment.                                               Tesfaye D. Felecia Shelling, M.D.    TDF/MEDQ  D:  05/03/2002  T:  05/04/2002  Job:  [id number]

## 2010-08-27 NOTE — H&P (Signed)
NAMEDELMAR, ARRIAGA            ACCOUNT NO.:  [id number]   MEDICAL RECORD NO.:  [id number]          PATIENT TYPE:  INP   LOCATION:  A310                          FACILITY:  [facility]   PHYSICIAN:  Tesfaye D. Felecia Shelling, MD   DATE OF BIRTH:  Jul 08, 1940   DATE OF ADMISSION:  11/25/2004  DATE OF DISCHARGE:  LH                                HISTORY & PHYSICAL   CHIEF COMPLAINT:  Pain and swelling of the left arm.   HISTORY OF PRESENT ILLNESS:  This is a 70 year old male patient with history  of multiple illnesses who is a resident of [facility] brought to  the office with above complaint.  The patient had gradually worsening pain  for the last 1 week on his left arm.  Her started noticing swelling in the  last 3 days.  The patient also developed some pus and discharge from the  swelling.  He had a low-grade fever.  The patient was then brought to the  office where he was evaluated and was admitted with a case of cellulitis and  abscess of the left arm.   REVIEW OF SYSTEMS:  CONSTITUTIONAL:  Intermittent headache and low-grade  fever.  GASTROINTESTINAL:  No nausea, vomiting, abdominal pain, dysuria,  urgency or frequency of urination.  CARDIOPULMONARY:  No cough, chest pain,  palpitations or shortness of breath.   PAST MEDICAL HISTORY:  1.  Status post CVA with left-sided hemiplegia.  2.  Diabetes mellitus.  3.  Diabetic neuropathy.  4.  Anemia.  5.  Hypertension.  6.  History of GI bleed.   CURRENT MEDICATIONS:  1.  Lortab 5/500 one tablet p.o. q.6h. p.r.n.  2.  Ambien 10 mg p.o. nightly.  3.  Paroxetine 30 mg p.o. daily.  4.  Plavix 75 mg p.o. daily.  5.  Prevacid 30 mg p.o. daily.  6.  Flomax 0.4 mg p.o. daily.  7.  Aspirin 325 mg p.o. daily.  8.  Avandia 4 mg p.o. daily.  9.  MiraLax 17 g p.o. daily.  10. Humulin insulin 70/30 18 units subcu nightly.  11. Glucophage 1000 mg p.o. b.i.d.  12. Reglan 5 mg p.o. a.c. and nightly.  13. Norvasc p.o. daily.  14. Lotensin  40 mg p.o. daily.  15. Doxazosin 1 mg p.o. daily.  16. Ferrous sulfate 325 mg p.o. daily.  17. Furosemide 20 mg p.o. daily.   SOCIAL HISTORY:  The patient is current resident of [facility].  He is disabled due to his illness.  The patient has no history of recent  tobacco, alcohol or substance abuse.   PHYSICAL EXAMINATION:  GENERAL:  The patient is alert and awake, acute sick-  looking.  VITAL SIGNS:  Blood pressure 130/80, pulse 88, respirations 16, temperature  99 degrees Fahrenheit.  HEENT:  Pupils are equal and reactive.  NECK:  Supple.  CHEST:  Clear lung fields.  Good air entry.  CARDIAC:  S1, S2 heard.  No murmur or gallop.  ABDOMEN:  Soft, flat.  Normoactive bowel sounds.  No masses or organomegaly.  EXTREMITIES:  No leg edema.  The patient has left-sided hemiplegia.  There  is diffuse swelling of the right arm around his deltoid muscle.  There is  also diffuse redness involving all over the right arm.  It is tender and  hot.  There is fluctuation and some pus with discharge.   LABORATORY DATA AND X-RAY FINDINGS:  WBC 13.4, hemoglobin 11.2, hematocrit  33.7, platelets 280.  Sodium 137, potassium 3.9, chloride 102, carbon  dioxide 28, glucose 219, BUN 14, creatinine 1.0.  Bilirubin 0.6, Alk phos  53, AST 19, ALT 11, total protein 6.0, albumin 3.0, calcium 8.5.   ASSESSMENT:  1.  Left arm cellulitis and abscess.  The patient has no apparent history of      trauma.  2.  Diabetes mellitus.  3.  Status post cerebrovascular accident with left-sided hemiplegia.  4.  History of anemia.  5.  Diabetic neuropathy.  6.  Hypertension.  7.  History of gastrointestinal bleed.   PLAN:  1.  We will start the patient empirically on combination of clindamycin and      vancomycin after obtaining blood culture.  2.  We will do also x-ray of the left humerus to rule out osteomyelitis.  3.  Surgical consult for possible incision and drainage.  4.  Get baseline labs including  CBC, Chem 7 and urinalysis.  5.  Continue the patient on his regular medications.  6.  Also start the patient on wet-to-dry dressings.      Tesfaye D. Felecia Shelling, MD  Electronically Signed     TDF/MEDQ  D:  11/25/2004  T:  11/25/2004  Job:  [id number]

## 2010-08-27 NOTE — Op Note (Signed)
NAMESUYASH, AMORY                        ACCOUNT NO.:  [id number]   MEDICAL RECORD NO.:  [id number]                   PATIENT TYPE:  OIB   LOCATION:  2860                                 FACILITY:  [facility]   PHYSICIAN:  Guadelupe Sabin, M.D.             DATE OF BIRTH:  January 01, 1941   DATE OF PROCEDURE:  01/22/2002  DATE OF DISCHARGE:                                 OPERATIVE REPORT   PREOPERATIVE DIAGNOSES:  1. Acute and chronic recurrent vitreous hemorrhage, left eye.  2. Diabetic retinopathy, proliferative type, left eye.  3. Background diabetic retinopathy, right eye.  4. Insulin-dependent diabetes mellitus.   SURGICAL PLAN:  Pars plana vitrectomy using vitreous infusion suction cutter  and associated endolaser photocoagulation and possible preretinal membrane  peeling.   SURGEON:  Guadelupe Sabin, M.D.   ASSISTANT:  Nurse.   ANESTHESIA:  General.   PHYSICAL EXAMINATION:  Ophthalmoscopy as previously described.  No  preoperative view due to dense recurrent vitreous hemorrhage, left eye.   DESCRIPTION OF PROCEDURE:  After the patient was prepped and draped, a lid  speculum was inserted in the left eye.  The eye was turned downward and a  superior rectus traction suture placed.  A peritomy was performed adjacent  to the limbus from the 9:30 to 4:00 o'clock position.  The corneoscleral  junction was cleaned and three sclerotomy sites were prepared with the 19  gauge MVR blade, 3.5 mm from the limbus at the 10, 2 and 4 o'clock  positions.  A preplaced mattress Dacron suture held the 4 mm infusion  terminal in place at the 4 o'clock position.  Due to the dense vitreous  hemorrhage details the tip could not been seen in the vitreous cavity until  later in the procedure.  The fiberoptic light pipe was inserted at the 2  o'clock position and the hand piece of the vitreous infusion suction cutter  at the 10 o'clock position.  Slow vitreous infusion suction cutting were  begun from an anterior to posterior direction.  Dense vitreous hemorrhage  okra colored with acute retinal red hemorrhage was noted.  As the retina was  approached there appeared to be neovascularization and proliferation at the  optic nerve.  These attachments were severed.  There seemed to be minimal  other retinopathy until later in the procedure when an area of localized  tractional retinal detachment and proliferation was noted at the 11:30  position in the periphery.  These were attached to a vitreous fibrovascular  stalk which was attached to the optic nerve and to the peripheral vitreous  base.  As the vitreous hemorrhage cleared scleral depression was used to  trim the dense vitreous hemorrhage back toward the pars plana area.  The  endolaser photocoagulator was assembled and a total of 1220 applications  made using a power level of 350 microwatts and a time of 1/10th of a second.  Good laser applications  were applied.  It was during this procedure that the  previously noted stalk of tissue was noted.  The vitreous infusion cutter  was reinserted and there appeared to be, as the stalk was cut, a retinal  hole under or in the base of the stalk indicating a probable localized area  of tractional retinal detachment.  This area had been previously surrounded  with laser.  The laser probe, however, was reintroduced and additional  applications applied around the hole.  It was then elected to perform a  vitreous air exchange to tamponade the hole.  The fundus was inspected with  indirect ophthalmoscopy and the hole was noted to be surrounded by laser  photocoagulation with a small amount of retinal hemorrhage present.  It was  therefore elected to close the vitreous.  The sclerotomy sites were closed  with 7-0 Vicryl sutures.  The conjunctiva was pulled forward and closed with  a running 7-0 Vicryl suture.  Depo-Garamycin and dexamethasone were injected  in the subtenon space inferiorly.   Duration of the procedure was one hour.  The patient tolerated the procedure well in general, left the operating room  for the recovery room in good condition.                                               Guadelupe Sabin, M.D.    HNJ/MEDQ  D:  01/22/2002  T:  01/23/2002  Job:  [id number]

## 2010-08-27 NOTE — H&P (Signed)
[name], [name]                        ACCOUNT NO.:  [id number]   MEDICAL RECORD NO.:  [id number]                   PATIENT TYPE:  OIB   LOCATION:  2860                                 FACILITY:  [facility]   PHYSICIAN:  [name], M.D.             DATE OF BIRTH:  [birth year]   DATE OF ADMISSION:  01/22/2002  DATE OF DISCHARGE:                                HISTORY & PHYSICAL   HISTORY OF PRESENT ILLNESS:  This was a planned outpatient surgical  admission of this 70 year old black male admitted with proliferative  diabetic retinopathy and vitreous hemorrhage of his left eye.  This patient  was seen by Dr. Jethro Buck in [place] where the patient resides at the  [facility]  Dr. Nile Buck noted the patient's sudden loss of  vision in the left eye and referred the patient to my office where  examination revealed a dense vitreous hemorrhage and probable proliferative  diabetic retinopathy.  The patient has a past history of insulin-dependent  diabetes mellitus and has had secondary complications of cerebrovascular  stroke effecting his left side.  His blood sugar was stated to be in control  on a combination of 18 units of 70/30 insulin in the evening and morning  Glucotrol and Avandia.  The patient takes other medications under the  direction of his regular physician, Dr. Felecia Buck in [place].  These include  Neurontin, Glucophage, Lotensin, Glucotrol, Lasix, Cardura, aspirin, Ambien,  Norvasc, Paxil, Plavix, Prevacid, stool softener and Tylenol.  He is felt to  be in stable condition at the present time.   REVIEW OF SYMPTOMS:  No current cardiorespiratory complaints.   PHYSICAL EXAMINATION:  GENERAL:  The patient is an alert 70 year old black  male in no acute ocular distress.  HEENT:  Eyes:  The eyes are white and clear with a clear cornea, deep and  clear anterior chamber.  Lens shows minimal nuclear lens sclerosis.  Detailed fundus examination of the  right eye shows a clear vitreous,  attached retina with background diabetic retinopathy consisting of a few  scattered hard exudates and microaneurysms.  The left eye reveals a dense  vitreous new and old hemorrhage and retinal details cannot be visualized.  CHEST: Lungs are clear to auscultation and percussion.  HEART:  Normal sinus rhythm.  No cardiomegaly.  No murmurs.  ABDOMEN:  Negative.  EXTREMITIES:  Previous cerebrovascular stroke effecting the left side.   ADMISSION DIAGNOSES:  1. Acute and recurrent vitreous hemorrhage, left eye, secondary to insulin-     dependent diabetes mellitus.  2. Background diabetic retinopathy, right eye.  3. Insulin-dependent diabetes mellitus.   SECONDARY DIAGNOSIS:  1. Cerebrovascular stroke effecting left side.   SURGICAL PLAN:  Pars plana vitrectomy with endolaser photocoagulation,  possible membrane peeling left eye.   The patient and his family have been given all discussion and printed  information concerning the procedure and its possible complications.  The  patient signed an informed consent and arrangements made for his outpatient  admission at this time.                                               [name], M.D.    HNJ/MEDQ  D:  01/22/2002  T:  01/23/2002  Job:  [id number]

## 2010-08-27 NOTE — Discharge Summary (Signed)
Buck, Randy            ACCOUNT NO.:  [id number]   MEDICAL RECORD NO.:  [id number]          PATIENT TYPE:  INP   LOCATION:  A310                          FACILITY:  [facility]   PHYSICIAN:  Tesfaye D. Felecia Shelling, MD   DATE OF BIRTH:  Sep 20, 1940   DATE OF ADMISSION:  11/25/2004  DATE OF DISCHARGE:  08/24/2006LH                                 DISCHARGE SUMMARY   DISCHARGE DIAGNOSES:  1.  Abscess and cellulitis of the left arm.  2.  MRSA in the wound.  3.  Diabetes mellitus.  4.  Status post CVA with left sided hemiplegia.  5.  Diabetic neuropathy.  6.  Anemia.  7.  Hypertension.  8.  History of GI bleed.   DISCHARGE MEDICATIONS:  1.  Plavix 75 mg p.o. daily.  2.  Accu-Chek with sliding scale coverage.  3.  Paxil 30 mg p.o. daily.  4.  Protonix 40 mg p.o. daily.  5.  Flomax 4 mg p.o. q.h.s.  6.  Aspirin 325 mg p.o. daily.  7.  Avandia 4 mg p.o. daily.  8.  MiraLax 17mg  p.o. daily.  9.  Humulin insulin 70/30 80 units subcu q.h.s.  10. Metformin 1000 mg p.o. b.i.d.  11. Reglan 5 mg p.o. a.c. and q.h.s.  12. Amlodipine 10 mg p.o. daily.  13. Lotensin 40 mg p.o. daily  14. Lasix 20 mg p.o. daily.  15. Cardura 1 mg p.o. daily.  16. Clindamycin 600 mg IV piggyback q.8 h x10 more days.  17. Vancomycin one gram IV piggyback q.12 h x10 more days.  18. Lortab 5/500 one tablet p.o. q.6 h. p.r.n.  19. Ambien 10 mg p.o. q.h.s.   DISPOSITION:  The patient will be transferred to [facility].   HOSPITAL COURSE:  This is a 70 year old male patient with history of  multiple medical illnesses who was admitted to [facility] on November 25, 2004, due to pain and swelling of the left arm.  The patient has a post  discharging abscess which was incised and drained by a surgeon.  He had also  surrounding tissue cellulitis.  He was started on __________ of IV  vancomycin and clindamycin.  His wound culture grew MRSA.  The patient was  continued on IV antibiotics.  His cellulitis  improved.  His wound is also  healing.  The patient needs about 2-3 weeks of IV antibiotics.  Arrangement  is being made to be transferred to [facility].  The patient will  continue his IV antibiotics and wound care.      Tesfaye D. Felecia Shelling, MD  Electronically Signed     TDF/MEDQ  D:  12/02/2004  T:  12/02/2004  Job:  [id number]

## 2010-08-27 NOTE — Op Note (Signed)
NAMEMAURICE, [name]                        ACCOUNT NO.:  [id number]   MEDICAL RECORD NO.:  [id number]                    PATIENT TYPE:   LOCATION:                                       FACILITY:   PHYSICIAN:  Lionel December, M.D.                 DATE OF BIRTH:   DATE OF PROCEDURE:  04/16/2002  DATE OF DISCHARGE:                                 OPERATIVE REPORT   PROCEDURE:  Total colonoscopy.   ENDOSCOPIST:  Lionel December, M.D.   INDICATIONS:  This patient is a 70 year old African-American male with  multiple medical problems who presented with melena, anemia, and lower  abdominal pain.  He has also been on ASA and platelets. He underwent  esophagogastroduodenoscopy yesterday and no significant lesion was found to  account for his GI bleed and anemia.  Colonoscopy was, therefore,  recommended.  He came this morning for colonoscopy.  His prep was  suboptimal, therefore, exam was abandoned.  He was reprepped and he is now  returning this afternoon for endocolonoscopy.   PREOPERATIVE MEDICATIONS:  Preoperative medications for both times Demerol  75 mg IV and Versed 6 mg IV in divided dose.   INSTRUMENT:  Olympus video system.   FINDINGS:  Procedure performed in endoscopy suite.  The patient's vital  signs and O2 saturation were monitored during the procedure and remained  stable.  The patient was placed in the left lateral recumbent position and  rectal examination performed.  He did not have any soft stool, at this time,  on digital examination.   The scope was placed in the rectum and advanced under vision into the  sigmoid colon.  He had stool scattered throughout the colon.  The prep was a  lot better than this morning although it is still suboptimal.  He has some  __________  in his colon too.  He had diffuse changes of melanosis coli;  pigmentation was more dense proximally where the colon was also black.  A  very redundant colon.   The scope was advanced to the  cecum which was identified by ileocecal valve  and appendiceal orifice.  Pictures were taken for the record.  As the scope  was withdrawn, the mucosa was once again carefully examined and no polyps,  tumor masses, angiodysplasia, or diverticular changes were noted.  Rectal  mucosa was normal.  The scope was retroflexed to examine the anorectal  junction which was unremarkable.  The endoscope was straightened and  withdrawn.  The patient tolerated the procedure well.   FINAL DIAGNOSES:  1. Examination performed to the cecum.  2. Redundant colon with melanosis coli.  3. Suboptimal prep, and I do not feel that significant pathology is missed.   DISCUSSION:  It is possible that his GI bleed is secondary to NSAID  enteropathy.    RECOMMENDATIONS:  1. Will start him on Metamucil 1 tablespoonful and MiraLax 17 gm p.o.  q.h.s.  2. He will also need to be on iron therapy.  Will consider further workup if     his anemia cannot be corrected with iron therapy or there is evidence of     recurrent GI bleed.                                                Lionel December, M.D.    NR/MEDQ  D:  04/16/2002  T:  04/16/2002  Job:  [id number]   cc:   Tesfaye D. Felecia Shelling, M.D.  [address]  [place]  [place] [address]  Fax: [phone]

## 2010-08-27 NOTE — Consult Note (Signed)
NAMEGREYSEN, DEVINO                        ACCOUNT NO.:  [id number]   MEDICAL RECORD NO.:  [id number]                   PATIENT TYPE:  INP   LOCATION:  A326                                 FACILITY:  [facility]   PHYSICIAN:  Lionel December, M.D.                 DATE OF BIRTH:  Oct 30, 1940   DATE OF CONSULTATION:  04/15/2002  DATE OF DISCHARGE:                                   CONSULTATION   REASON FOR CONSULTATION:  GI bleed, anemia.   HISTORY OF PRESENT ILLNESS:  The patient is a 70 year old black gentleman  with history of insulin-dependent diabetes mellitus, CVA with right  hemiplegia, and hypertension, who was admitted with GI bleed and anemia  yesterday.  He reports a several-week history of intermittent melena.  Yesterday at [facility] he was noted to have melena and sharp  lower abdominal pain.  He was brought to the emergency department, where he  was found to have black, Hemoccult-positive stool.  On admission his  hemoglobin was 10.1, hematocrit 31.5, MCV 80.4.  It was noted back in July  of this year that his hemoglobin was 9.6, hematocrit 29.8, MCV 78.7.   Again, the patient complains of intermittent melena over the last several  weeks.  He denies any bright red blood per rectum, dysphagia, heartburn,  nausea, vomiting, chest pain, or shortness of breath.  He complains of  constipation, generally has a bowel movement every two to three days.  He  also gives history of remote peptic ulcer disease 10 years ago, at which  time he was diagnosed by x-ray per his report.  He has never had a  colonoscopy.   Since admission he has had no further melena.  His hemoglobin has dropped to  8.9, hematocrit 27.7.   At [facility] home he is on aspirin 325 mg daily, Plavix 75 mg daily,  and Prevacid 30 mg daily.   MEDICATIONS:  1. Aspirin 325 mg daily.  2. Plavix 75 mg daily.  3. Neurontin 600 mg t.i.d.  4. Glucophage 1000 mg b.i.d.  5. Lotensin 40 mg q.d.  6.  Humulin 70/30 18 units q.p.m.  7. Glucotrol XL 10 mg q.d.  8. Lasix 20 mg q.d.  9. Cardura 1 mg q.d.  10.      Avandia 4 mg b.i.d.  11.      Ambien 10 mg q.h.s.  12.      Norvasc 10 mg q.d.  13.      Paxil 30 mg q.d.  14.      Prevacid 30 mg q.d.  15.      Tylenol 500 mg two daily.  16.      Stool softener two daily.   ALLERGIES:  No known drug allergies.   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  He had a vitreous hemorrhage of the  left eye  due to his diabetes and is status post pars plana vitrectomy.  He has had  CVA x2 and has right hemiplegia.  He has hypertension.  Remote history of  peptic ulcer disease as outlined above.   FAMILY HISTORY:  He reports that his brother had peptic ulcer disease.  He  is in his 45s.  No family history of colorectal cancer.   SOCIAL HISTORY:  He is a resident of [facility].  He is single.  He has no children.  He denies any tobacco or alcohol use.   REVIEW OF SYSTEMS:  GASTROINTESTINAL:  Please see HPI.  GENERAL:  Denies any  weight loss.  CARDIOPULMONARY:  Please see HPI.  In addition, denies any  palpitations.  GENITOURINARY:  Denies any dysuria or hematuria.   PHYSICAL EXAMINATION:  VITAL SIGNS:  Height 5 feet 9 inches, weight 168.7,  temperature 98.5, pulse 67, respirations 20, blood pressure 127/72.  GENERAL:  A pleasant,  well-developed, well-nourished black male in no acute  distress.  SKIN:  Warm and dry, no jaundice.  HEENT:  Conjunctivae are pale, sclerae nonicteric.  Oropharyngeal mucosa  moist and pink.  No lesions, erythema, or exudate.  Pupils equal, round, and  reactive to light.  NECK:  No lymphadenopathy, thyromegaly, or carotid bruits.  CHEST:  Lungs are clear to auscultation.  CARDIAC:  Regular rate and rhythm, a 1/6 systolic ejection murmur heard best  in the upper right precordium.  ABDOMEN:  Positive bowel sounds, soft, nontender, nondistended,  no  organomegaly or masses.  RECTAL:  Rectal examination performed in the ED with dark Hemoccult-positive  stools.  EXTREMITIES:  No edema.   LABORATORY DATA:  As mentioned in HPI.  In addition, WBC 6.3, platelets  332,000.  Sodium 138, potassium 4, BUN 11, creatinine 0.9, glucose 84.  Amylase 131, lipase 31.   IMPRESSION:  The patient is a pleasant 70 year old black gentleman with a  history of melena/Hemoccult-positive stool, lower abdominal pain, normocytic  anemia.  The patient gives a history of several weeks of intermittent  melena.  His baseline hemoglobin and hematocrit are unknown; however, in  July 2003 they were noted to be 9.6 and 29.8, respectively.  Given above  findings, would be concerned about peptic ulcer disease.  He is at increased  risk given aspirin use, although he is on a proton pump inhibitor apparently  as well.  He could have chronic intermittent oozing from upper  gastrointestinal tract or anywhere along the gastrointestinal tract given  aspirin and Plavix use.  Currently he is hemodynamically stable.  At this  time he does have an acute gastrointestinal bleed; however, it is unclear  whether he has chronic gastrointestinal bleed as well.   RECOMMENDATIONS:  1. EGD today.  2. Consider colonoscopy at a later date.  3. Hold aspirin, NSAIDs, and Plavix for now, as you are doing.  4.     Continue IV Protonix 40 mg q.24h.  5. Further recommendations to follow.   I would like to thank Dr. Felecia Shelling for allowing [place] to take part in the care of  this patient.     Tana Coast, P.A.                        Lionel December, M.D.    LL/MEDQ  D:  04/15/2002  T:  04/15/2002  Job:  [id number]   cc:   Tesfaye D. Felecia Shelling, M.D.  [address]  [address]  [place]  [place] [address]  Fax: [phone]

## 2010-08-27 NOTE — Op Note (Signed)
[name], [name]                        ACCOUNT NO.:  [id number]   MEDICAL RECORD NO.:  [id number]                   PATIENT TYPE:  INP   LOCATION:  A326                                 FACILITY:  [facility]   PHYSICIAN:  Lionel December, M.D.                 DATE OF BIRTH:  Aug 06, 1940   DATE OF PROCEDURE:  04/15/2002  DATE OF DISCHARGE:                                  PROCEDURE NOTE   PROCEDURE:  Esophagogastroduodenoscopy.   INDICATIONS:  The patient is a 70 year old African-American male with  multiple medical problems, who presented with melena and anemia.  He is on  ASA and Plavix.  He is complaining of lower abdominal pain.  He is  undergoing diagnostic esophagogastroduodenoscopy.  Procedure was reviewed  with the patient and informed consent was obtained.  I also talked with his  sister, [name].   PREOPERATIVE MEDICATIONS:  Cetacaine spray for pharyngeal topical  anesthesia, Demerol 25 mg IV, Versed 3 mg IV in divided dose.   INSTRUMENT USED:  Olympus video system.   FINDINGS:  The procedure was performed in the endoscopy suite.  The  patient's vital signs and O2 saturations were monitored during the procedure  and remained stable.  The patient was placed in the left lateral decubitus  position and the endoscope was passed oropharynx without any difficulty into  the esophagus.   ESOPHAGUS AND MUCOSA:  The esophagus was normal throughout.  He had a small  sliding hiatal hernia without ring or stricture formation.   STOMACH:  It was empty and distended with very well insufflation.  Folds and  proximal stomach were normal.  Examination of the mucosa revealed antral  erythema and two erosions.  There was no ulcer crater.  Pyloric channel was  patent.  Angularis and fundus were examined by retroflexion of the scope and  were normal.   DUODENUM:  Examination of the bulb revealed small, flat polyp, which was  either inflammatory or Brunner gland hypertrophy.  Biopsy was  taken to make  sure this was not an adenoma.  The scope was passed to the second part of  the duodenum.  Mucosa and folds were normal.   The endoscope was withdrawn.  The patient tolerated the procedure well.   FINAL DIAGNOSES:  Small sliding hiatal hernia, erosive antral gastritis, and  a small flat duodenal polyp - possibly Brunner gland hyperplasia or  hyperplastic polyp.   These findings would not explain the patient's GI blood loss and anemia.   RECOMMENDATIONS:  Helicobacter pylori serology recheck today.  He will be  prepped for total colonoscopy to be performed on April 16, 2002.  Lionel December, M.D.    NR/MEDQ  D:  04/15/2002  T:  04/15/2002  Job:  [id number]   cc:   Dr. _________________

## 2015-01-07 ENCOUNTER — Emergency Department (HOSPITAL_COMMUNITY)
Admission: EM | Admit: 2015-01-07 | Discharge: 2015-01-07 | Disposition: A | Discharge status: Home / Self Care | Payer: Medicare Other | Attending: Emergency Medicine | Admitting: Emergency Medicine

## 2015-01-07 ENCOUNTER — Encounter (HOSPITAL_COMMUNITY): Payer: Self-pay | Admitting: *Deleted

## 2015-01-07 ENCOUNTER — Emergency Department (HOSPITAL_COMMUNITY): Payer: Medicare Other

## 2015-01-07 DIAGNOSIS — I2510 Atherosclerotic heart disease of native coronary artery without angina pectoris: Secondary | ICD-10-CM | POA: Insufficient documentation

## 2015-01-07 DIAGNOSIS — F0390 Unspecified dementia without behavioral disturbance: Secondary | ICD-10-CM | POA: Diagnosis not present

## 2015-01-07 DIAGNOSIS — F329 Major depressive disorder, single episode, unspecified: Secondary | ICD-10-CM | POA: Diagnosis not present

## 2015-01-07 DIAGNOSIS — Z7982 Long term (current) use of aspirin: Secondary | ICD-10-CM | POA: Diagnosis not present

## 2015-01-07 DIAGNOSIS — I10 Essential (primary) hypertension: Secondary | ICD-10-CM | POA: Insufficient documentation

## 2015-01-07 DIAGNOSIS — K5900 Constipation, unspecified: Secondary | ICD-10-CM | POA: Diagnosis not present

## 2015-01-07 DIAGNOSIS — E119 Type 2 diabetes mellitus without complications: Secondary | ICD-10-CM | POA: Insufficient documentation

## 2015-01-07 DIAGNOSIS — Z794 Long term (current) use of insulin: Secondary | ICD-10-CM | POA: Diagnosis not present

## 2015-01-07 DIAGNOSIS — N429 Disorder of prostate, unspecified: Secondary | ICD-10-CM | POA: Insufficient documentation

## 2015-01-07 DIAGNOSIS — Z79899 Other long term (current) drug therapy: Secondary | ICD-10-CM | POA: Insufficient documentation

## 2015-01-07 DIAGNOSIS — R1030 Lower abdominal pain, unspecified: Secondary | ICD-10-CM | POA: Diagnosis present

## 2015-01-07 DIAGNOSIS — F411 Generalized anxiety disorder: Secondary | ICD-10-CM | POA: Diagnosis not present

## 2015-01-07 DIAGNOSIS — R339 Retention of urine, unspecified: Secondary | ICD-10-CM | POA: Insufficient documentation

## 2015-01-07 DIAGNOSIS — C801 Malignant (primary) neoplasm, unspecified: Secondary | ICD-10-CM | POA: Diagnosis not present

## 2015-01-07 DIAGNOSIS — C799 Secondary malignant neoplasm of unspecified site: Secondary | ICD-10-CM

## 2015-01-07 HISTORY — DX: Atherosclerotic heart disease of native coronary artery without angina pectoris: I25.10

## 2015-01-07 HISTORY — DX: Type 2 diabetes mellitus without complications: E11.9

## 2015-01-07 HISTORY — DX: Unspecified dementia, unspecified severity, without behavioral disturbance, psychotic disturbance, mood disturbance, and anxiety: F03.90

## 2015-01-07 HISTORY — DX: Essential (primary) hypertension: I10

## 2015-01-07 HISTORY — DX: Major depressive disorder, single episode, unspecified: F32.9

## 2015-01-07 HISTORY — DX: Gastro-esophageal reflux disease without esophagitis: K21.9

## 2015-01-07 HISTORY — DX: Depression, unspecified: F32.A

## 2015-01-07 HISTORY — DX: Generalized anxiety disorder: F41.1

## 2015-01-07 HISTORY — DX: Constipation, unspecified: K59.00

## 2015-01-07 LAB — CBC
HCT: 38.8 % — ABNORMAL LOW (ref 39.0–52.0)
Hemoglobin: 12.8 g/dL — ABNORMAL LOW (ref 13.0–17.0)
MCH: 30.3 pg (ref 26.0–34.0)
MCHC: 33 g/dL (ref 30.0–36.0)
MCV: 91.7 fL (ref 78.0–100.0)
PLATELETS: 319 10*3/uL (ref 150–400)
RBC: 4.23 MIL/uL (ref 4.22–5.81)
RDW: 14.3 % (ref 11.5–15.5)
WBC: 8.5 10*3/uL (ref 4.0–10.5)

## 2015-01-07 LAB — URINALYSIS, ROUTINE W REFLEX MICROSCOPIC
BILIRUBIN URINE: NEGATIVE
GLUCOSE, UA: NEGATIVE mg/dL
KETONES UR: NEGATIVE mg/dL
Nitrite: NEGATIVE
PH: 6 (ref 5.0–8.0)
Protein, ur: NEGATIVE mg/dL
Specific Gravity, Urine: 1.01 (ref 1.005–1.030)
Urobilinogen, UA: 1 mg/dL (ref 0.0–1.0)

## 2015-01-07 LAB — URINE MICROSCOPIC-ADD ON

## 2015-01-07 LAB — COMPREHENSIVE METABOLIC PANEL
ALT: 15 U/L — AB (ref 17–63)
AST: 30 U/L (ref 15–41)
Albumin: 3.6 g/dL (ref 3.5–5.0)
Alkaline Phosphatase: 164 U/L — ABNORMAL HIGH (ref 38–126)
Anion gap: 10 (ref 5–15)
BUN: 31 mg/dL — AB (ref 6–20)
CHLORIDE: 98 mmol/L — AB (ref 101–111)
CO2: 30 mmol/L (ref 22–32)
CREATININE: 1.28 mg/dL — AB (ref 0.61–1.24)
Calcium: 8.5 mg/dL — ABNORMAL LOW (ref 8.9–10.3)
GFR calc Af Amer: >60 mL/min (ref >60)
GFR, EST NON AFRICAN AMERICAN: 53 mL/min — AB (ref >60)
Glucose, Bld: 184 mg/dL — ABNORMAL HIGH (ref 65–99)
Potassium: 3.8 mmol/L (ref 3.5–5.1)
SODIUM: 138 mmol/L (ref 135–145)
Total Bilirubin: 0.8 mg/dL (ref 0.3–1.2)
Total Protein: 7.2 g/dL (ref 6.5–8.1)

## 2015-01-07 LAB — LIPASE, BLOOD: LIPASE: 13 U/L — AB (ref 22–51)

## 2015-01-07 LAB — I-STAT CG4 LACTIC ACID, ED: Lactic Acid, Venous: 1.67 mmol/L (ref 0.5–2.0)

## 2015-01-07 MED ORDER — SODIUM CHLORIDE 0.9 % IV BOLUS (SEPSIS)
500.0000 mL | Freq: Once | INTRAVENOUS | Status: AC
  Administered 2015-01-07: 500 mL via INTRAVENOUS

## 2015-01-07 MED ORDER — IOHEXOL 300 MG/ML  SOLN
100.0000 mL | Freq: Once | INTRAMUSCULAR | Status: AC | PRN
  Administered 2015-01-07: 100 mL via INTRAVENOUS

## 2015-01-07 NOTE — ED Notes (Signed)
EMS contacted to transport pt back to nursing home.

## 2015-01-07 NOTE — ED Notes (Signed)
Pt comes from [facility] in [place]. Pt started c/o lower abdominal pain starting yesterday.  Facility gave pt enema with good response. NAD noted.    IV 20 Left FA.  CBG 149

## 2015-01-07 NOTE — ED Provider Notes (Signed)
CSN: [id number]     Arrival date & time 01/07/15  1400 History   First MD Initiated Contact with Patient 01/07/15 1426     Chief Complaint  Patient presents with  . Abdominal Pain     (Consider location/radiation/quality/duration/timing/severity/associated sxs/prior Treatment) HPI Comments: 74 y.o. Male with history of dementia, HTN, DM presents from his nursing facility for lower abdominal pain.  Per report the patient started complaining of the lower abdominal pain today.  He has not had a known fever.  No vomiting.  He was given an enema with good bowel movement afterwards.  Patient is not able to give any history.   Past Medical History  Diagnosis Date  . Hypertension   . Constipation   . Dementia   . GERD (gastroesophageal reflux disease)   . Coronary artery disease   . GAD (generalized anxiety disorder)   . Diabetes mellitus without complication   . Depressive disorder    History reviewed. No pertinent past surgical history. No family history on file. Social History  Substance Use Topics  . Smoking status: Unknown If Ever Smoked  . Smokeless tobacco: None  . Alcohol Use: No    Review of Systems  Unable to perform ROS: Dementia      Allergies  Review of patient's allergies indicates no known allergies.  Home Medications   Prior to Admission medications   Medication Sig Start Date End Date Taking? Authorizing Provider  amLODipine (NORVASC) 5 MG tablet Take 5 mg by mouth daily. 12/16/14  Yes Historical Provider, MD  aspirin EC 81 MG tablet Take 81 mg by mouth daily.   Yes Historical Provider, MD  atorvastatin (LIPITOR) 10 MG tablet Take 10 mg by mouth daily. 12/23/14  Yes Historical Provider, MD  benazepril (LOTENSIN) 20 MG tablet Take 40 mg by mouth daily. 12/02/14  Yes Historical Provider, MD  chlorthalidone (HYGROTON) 25 MG tablet Take 25 mg by mouth daily. 01/03/15  Yes Historical Provider, MD  cloNIDine (CATAPRES) 0.1 MG tablet Take 0.1 mg by mouth 2 (two) times  daily. 01/03/15  Yes Historical Provider, MD  docusate sodium (COLACE) 100 MG capsule Take 100 mg by mouth 2 (two) times daily.   Yes Historical Provider, MD  LEVEMIR FLEXTOUCH 100 UNIT/ML Pen Inject 50 Units into the skin daily. Am 01/03/15  Yes Historical Provider, MD  loxapine (LOXITANE) 5 MG capsule Take 5 mg by mouth 2 (two) times daily. 12/31/14  Yes Historical Provider, MD  metFORMIN (GLUCOPHAGE) 500 MG tablet Take 500 mg by mouth daily. 12/28/14  Yes Historical Provider, MD  metoprolol succinate (TOPROL-XL) 25 MG 24 hr tablet Take 12.5 mg by mouth daily. 01/07/15  Yes Historical Provider, MD  nitroGLYCERIN (NITROSTAT) 0.4 MG SL tablet Place 0.4 mg under the tongue every 5 (five) minutes as needed for chest pain.   Yes Historical Provider, MD  NOVOLOG FLEXPEN 100 UNIT/ML FlexPen Inject 2-10 Units into the skin daily. 150-200=2units 201-250=4units 251-300=6units 301-350=8units Greater than 350 give 10 units then call MD 12/18/14  Yes Historical Provider, MD  senna-docusate (SENOKOT-S) 8.6-50 MG tablet Take 2 tablets by mouth at bedtime.   Yes Historical Provider, MD  sertraline (ZOLOFT) 100 MG tablet Take 150 mg by mouth daily. 12/14/14  Yes Historical Provider, MD   BP 103/53 mmHg  Pulse 66  Temp(Src) 98.7 F (37.1 C) (Oral)  Resp 20  SpO2 94% Physical Exam  Constitutional: No distress.  HENT:  Head: Normocephalic and atraumatic.  Eyes: EOM are normal. Pupils are equal,  round, and reactive to light.  Neck: Normal range of motion. Neck supple.  Cardiovascular: Normal rate and intact distal pulses.  An irregularly irregular rhythm present.  Pulmonary/Chest: Effort normal. No respiratory distress. He has no wheezes. He has no rales.  Abdominal: Soft. There is tenderness in the suprapubic area. There is no rigidity and no guarding.  Musculoskeletal: He exhibits no edema.  Neurological: He is alert. He exhibits normal muscle tone.  Skin: Skin is warm and dry. He is not diaphoretic.  Vitals  reviewed.   ED Course  Procedures (including critical care time) Labs Review Labs Reviewed  LIPASE, BLOOD - Abnormal; Notable for the following:    Lipase 13 (*)    All other components within normal limits  COMPREHENSIVE METABOLIC PANEL - Abnormal; Notable for the following:    Chloride 98 (*)    Glucose, Bld 184 (*)    BUN 31 (*)    Creatinine, Ser 1.28 (*)    Calcium 8.5 (*)    ALT 15 (*)    Alkaline Phosphatase 164 (*)    GFR calc non Af Amer 53 (*)    All other components within normal limits  CBC - Abnormal; Notable for the following:    Hemoglobin 12.8 (*)    HCT 38.8 (*)    All other components within normal limits  URINALYSIS, ROUTINE W REFLEX MICROSCOPIC (NOT AT [facility]) - Abnormal; Notable for the following:    Hgb urine dipstick MODERATE (*)    Leukocytes, UA TRACE (*)    All other components within normal limits  URINE MICROSCOPIC-ADD ON - Abnormal; Notable for the following:    Bacteria, UA FEW (*)    All other components within normal limits  I-STAT CG4 LACTIC ACID, ED    Imaging Review Ct Abdomen Pelvis W Contrast  01/07/2015   CLINICAL DATA:  Lower abdominal pain for 1 day. Symptoms reportedly improved after enema.  EXAM: CT ABDOMEN AND PELVIS WITH CONTRAST  TECHNIQUE: Multidetector CT imaging of the abdomen and pelvis was performed using the standard protocol following bolus administration of intravenous contrast.  CONTRAST:  168mL OMNIPAQUE IOHEXOL 300 MG/ML  SOLN  COMPARISON:  None.  FINDINGS: Lower chest: No significant pulmonary nodules or acute consolidative airspace disease. Left anterior descending coronary artery calcifications are noted.  Hepatobiliary: Normal liver with no liver mass. Normal gallbladder with no radiopaque cholelithiasis. No biliary ductal dilatation.  Pancreas: Normal, with no mass or duct dilation.  Spleen: Normal size. No mass.  Adrenals/Urinary Tract: There is mild irregularity of the bilateral adrenal glands without discrete adrenal  nodule. Nonobstructing 5 mm stone in the right lower kidney. Nonobstructing 7 mm stone in the left lower kidney. Simple 4.9 cm and 2.4 cm renal cysts in the left lower kidney. Additional subcentimeter hypodense lesions in both kidneys are too small to characterize. No hydronephrosis. Prominently distended urinary bladder with normal bladder thickness, no bladder stones, no bladder diverticula and no bladder mass.  Stomach/Bowel: Grossly normal stomach. Normal caliber small bowel with no small bowel wall thickening. Appendix is not discretely visualized. Normal large bowel with no diverticulosis, large bowel wall thickening or pericolonic fat stranding.  Vascular/Lymphatic: Atherosclerotic nonaneurysmal abdominal aorta. Patent portal, splenic, hepatic and renal veins. There are multiple moderately enlarged left para-aortic lymph nodes, largest 2.4 cm (series 2/ image 51). There is a moderately enlarged 1.7 cm right common iliac node (2/57). There is a moderately enlarged 1.9 cm left common iliac node (2/55). There is a moderately enlarged  1.7 cm left external iliac node (2/67). There is a moderately enlarged 1.6 cm left internal iliac node (2/70).  Reproductive: The prostate is irregular in contour and moderately enlarged. There is an apparent TURP defect in the central prostate. There is the suggestion of an ill-defined hypodense 2.3 x 2.3 cm mass at the left base of the prostate likely extending into the left seminal vesicle (series 2/ image 72).  Other: No pneumoperitoneum, ascites or focal fluid collection.  Musculoskeletal: There are innumerable small sclerotic osseous lesions of various size throughout the axial skeleton involving the bilateral lower ribs, visualized thoracic spine, lumbosacral spine, bilateral iliac bones and proximal femora, most in keeping with extensive sclerotic osseous metastatic disease. There are subacute fractures of the lateral right sixth through tenth ribs in various stages of  healing, with minimal displacement of the right lateral tenth rib fracture. Moderate degenerative changes are seen in the visualized thoracolumbar spine. Visualized thoracolumbar spine vertebral body heights are preserved.  IMPRESSION: 1. Widespread sclerotic osseous metastatic disease throughout the axial skeleton. Multiple subacute pathologic fractures of the lateral right sixth through tenth ribs in various stages of healing. 2. Bulky left para-aortic and bilateral iliac lymphadenopathy, likely metastatic. 3. Moderately enlarged irregular prostate. Suggestion of an ill-defined hypodense 2.3 cm prostatic mass at the left posterior base of the prostate, suspicious for prostate cancer. Recommend correlation with PSA. Consider prostate MRI for further evaluation. 4. Prominently distended urinary bladder, cannot exclude bladder outlet obstruction by the enlarged prostate. 5. Nonobstructing stones in both kidneys.  No hydronephrosis.  These results were called by telephone at the time of interpretation on 01/07/2015 at 4:26 pm to Dr. Lonia Skinner , who verbally acknowledged these results.   Electronically Signed   By: Ilona Sorrel M.D.   On: 01/07/2015 16:28   I have personally reviewed and evaluated these images and lab results as part of my medical decision-making.   EKG Interpretation   Date/Time:  Wednesday January 07 2015 14:17:24 EDT Ventricular Rate:  117 PR Interval:  63 QRS Duration: 82 QT Interval:  339 QTC Calculation: 473 R Axis:   73 Text Interpretation:  Sinus tachycardia with irregular rate Probable  anteroseptal infarct, old Artifact No significant change since last  tracing Confirmed by Lonia Skinner ([id number]) on 01/07/2015 2:53:46 PM      MDM  Patient was seen and evaluated in stable condition.  Laboratory results relatively unremarkable for patient other than mild increase in Cr.  EKG without acute finding.  CT with enlarged bladder and findings concerning for metastatic CA which  appears most likely on CT to be coming from the prostate.  Foley catheter placed without difficulty.  Draining well.  Patient initially with >1000 cc out of his bladder but hemodynamically remained stable afterwards.  Attempted to contact nursing facility without success.  Discussed with hospitalist who said no reason for admission at this time.  Patient was discharged back to his nursing facility in stable condition with foley in place and instruction to follow up with urology regarding possible prostate cancer and urinary retention. Final diagnoses:  Urinary retention  Metastatic cancer  Prostate disease    1. Urinary retention  2. 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

## 2015-01-07 NOTE — Discharge Instructions (Signed)
Acute Urinary Retention Your CT showed that you have what looks like metastatic Prostate CA and that your urinary retention which was causing your belly pain is likely related to this.  Keep the foley catheter in place until you follow up with urology outpatient.  They also need to see you for evaluation of your possible prostate cancer. Acute urinary retention is the temporary inability to urinate. This is a common problem in older men. As men age their prostates become larger and block the flow of urine from the bladder. This is usually a problem that has come on gradually.  HOME CARE INSTRUCTIONS If you are sent home with a Foley catheter and a drainage system, you will need to discuss the best course of action with your health care provider. While the catheter is in, maintain a good intake of fluids. Keep the drainage bag emptied and lower than your catheter. This is so that contaminated urine will not flow back into your bladder, which could lead to a urinary tract infection. There are two main types of drainage bags. One is a large bag that usually is used at night. It has a good capacity that will allow you to sleep through the night without having to empty it. The second type is called a leg bag. It has a smaller capacity, so it needs to be emptied more frequently. However, the main advantage is that it can be attached by a leg strap and can go underneath your clothing, allowing you the freedom to move about or leave your home. Only take over-the-counter or prescription medicines for pain, discomfort, or fever as directed by your health care provider.  SEEK MEDICAL CARE IF:  You develop a low-grade fever.  You experience spasms or leakage of urine with the spasms. SEEK IMMEDIATE MEDICAL CARE IF:  1. You develop chills or fever. 2. Your catheter stops draining urine. 3. Your catheter falls out. 4. You start to develop increased bleeding that does not respond to rest and increased fluid  intake. MAKE SURE YOU: 1. Understand these instructions. 2. Will watch your condition. 3. Will get help right away if you are not doing well or get worse. Document Released: 07/04/2000 Document Revised: 04/02/2013 Document Reviewed: 09/06/2012 [facility] Patient Information 2015 [place], [place]. This information is not intended to replace advice given to you by your health care provider. Make sure you discuss any questions you have with your health care provider.  Foley Catheter Care A Foley catheter is a soft, flexible tube that is placed into the bladder to drain urine. A Foley catheter may be inserted if:  You leak urine or are not able to control when you urinate (urinary incontinence).  You are not able to urinate when you need to (urinary retention).  You had prostate surgery or surgery on the genitals.  You have certain medical conditions, such as multiple sclerosis, dementia, or a spinal cord injury. If you are going home with a Foley catheter in place, follow the instructions below. TAKING CARE OF THE CATHETER 5. Wash your hands with soap and water. 6. Using mild soap and warm water on a clean washcloth:  Clean the area on your body closest to the catheter insertion site using a circular motion, moving away from the catheter. Never wipe toward the catheter because this could sweep bacteria up into the urethra and cause infection.  Remove all traces of soap. Pat the area dry with a clean towel. For males, reposition the foreskin. 7. Attach the  catheter to your leg so there is no tension on the catheter. Use adhesive tape or a leg strap. If you are using adhesive tape, remove any sticky residue left behind by the previous tape you used. 8. Keep the drainage bag below the level of the bladder, but keep it off the floor. 9. Check throughout the day to be sure the catheter is working and urine is draining freely. Make sure the tubing does not become kinked. 10. Do not pull on the  catheter or try to remove it. Pulling could damage internal tissues. TAKING CARE OF THE DRAINAGE BAGS You will be given two drainage bags to take home. One is a large overnight drainage bag, and the other is a smaller leg bag that fits underneath clothing. You may wear the overnight bag at any time, but you should never wear the smaller leg bag at night. Follow the instructions below for how to empty, change, and clean your drainage bags. Emptying the Drainage Bag You must empty your drainage bag when it is  - full or at least 2-3 times a day. 4. Wash your hands with soap and water. 5. Keep the drainage bag below your hips, below the level of your bladder. This stops urine from going back into the tubing and into your bladder. 6. Hold the dirty bag over the toilet or a clean container. 7. Open the pour spout at the bottom of the bag and empty the urine into the toilet or container. Do not let the pour spout touch the toilet, container, or any other surface. Doing so can place bacteria on the bag, which can cause an infection. 8. Clean the pour spout with a gauze pad or cotton ball that has rubbing alcohol on it. 9. Close the pour spout. 10. Attach the bag to your leg with adhesive tape or a leg strap. 11. Wash your hands well. Changing the Drainage Bag Change your drainage bag once a month or sooner if it starts to smell bad or look dirty. Below are steps to follow when changing the drainage bag. 1. Wash your hands with soap and water. 2. Pinch off the rubber catheter so that urine does not spill out. 3. Disconnect the catheter tube from the drainage tube at the connection valve. Do not let the tubes touch any surface. 4. Clean the end of the catheter tube with an alcohol wipe. Use a different alcohol wipe to clean the end of the drainage tube. 5. Connect the catheter tube to the drainage tube of the clean drainage bag. 6. Attach the new bag to the leg with adhesive tape or a leg strap. Avoid  attaching the new bag too tightly. 7. Wash your hands well. Cleaning the Drainage Bag 1. Wash your hands with soap and water. 2. Wash the bag in warm, soapy water. 3. Rinse the bag thoroughly with warm water. 4. Fill the bag with a solution of white vinegar and water (1 cup vinegar to 1 qt warm water [.2 L vinegar to 1 L warm water]). Close the bag and soak it for 30 minutes in the solution. 5. Rinse the bag with warm water. 6. Hang the bag to dry with the pour spout open and hanging downward. 7. Store the clean bag (once it is dry) in a clean plastic bag. 8. Wash your hands well. PREVENTING INFECTION  Wash your hands before and after handling your catheter.  Take showers daily and wash the area where the catheter enters your body.  Do not take baths. Replace wet leg straps with dry ones, if this applies.  Do not use powders, sprays, or lotions on the genital area. Only use creams, lotions, or ointments as directed by your caregiver.  For females, wipe from front to back after each bowel movement.  Drink enough fluids to keep your urine clear or pale yellow unless you have a fluid restriction.  Do not let the drainage bag or tubing touch or lie on the floor.  Wear cotton underwear to absorb moisture and to keep your skin drier. SEEK MEDICAL CARE IF:   Your urine is cloudy or smells unusually bad.  Your catheter becomes clogged.  You are not draining urine into the bag or your bladder feels full.  Your catheter starts to leak. SEEK IMMEDIATE MEDICAL CARE IF:   You have pain, swelling, redness, or pus where the catheter enters the body.  You have pain in the abdomen, legs, lower back, or bladder.  You have a fever.  You see blood fill the catheter, or your urine is pink or red.  You have nausea, vomiting, or chills.  Your catheter gets pulled out. MAKE SURE YOU:   Understand these instructions.  Will watch your condition.  Will get help right away if you are not  doing well or get worse. Document Released: 03/28/2005 Document Revised: 08/12/2013 Document Reviewed: 03/19/2012 [facility] Patient Information 2015 [place], [place]. This information is not intended to replace advice given to you by your health care provider. Make sure you discuss any questions you have with your health care provider.

## 2015-01-07 NOTE — ED Notes (Signed)
EMS in department to transport pt back.

## 2015-04-03 ENCOUNTER — Encounter (HOSPITAL_COMMUNITY): Payer: Self-pay | Admitting: *Deleted

## 2015-04-03 ENCOUNTER — Emergency Department (HOSPITAL_COMMUNITY)

## 2015-04-03 ENCOUNTER — Emergency Department (HOSPITAL_COMMUNITY)
Admission: EM | Admit: 2015-04-03 | Discharge: 2015-04-03 | Disposition: A | Discharge status: Home / Self Care | Attending: Emergency Medicine | Admitting: Emergency Medicine

## 2015-04-03 DIAGNOSIS — Z794 Long term (current) use of insulin: Secondary | ICD-10-CM | POA: Insufficient documentation

## 2015-04-03 DIAGNOSIS — K5900 Constipation, unspecified: Secondary | ICD-10-CM | POA: Insufficient documentation

## 2015-04-03 DIAGNOSIS — R339 Retention of urine, unspecified: Secondary | ICD-10-CM | POA: Diagnosis not present

## 2015-04-03 DIAGNOSIS — I2510 Atherosclerotic heart disease of native coronary artery without angina pectoris: Secondary | ICD-10-CM | POA: Diagnosis not present

## 2015-04-03 DIAGNOSIS — F329 Major depressive disorder, single episode, unspecified: Secondary | ICD-10-CM | POA: Insufficient documentation

## 2015-04-03 DIAGNOSIS — I10 Essential (primary) hypertension: Secondary | ICD-10-CM | POA: Insufficient documentation

## 2015-04-03 DIAGNOSIS — Z79899 Other long term (current) drug therapy: Secondary | ICD-10-CM | POA: Diagnosis not present

## 2015-04-03 DIAGNOSIS — E162 Hypoglycemia, unspecified: Secondary | ICD-10-CM

## 2015-04-03 DIAGNOSIS — Z7982 Long term (current) use of aspirin: Secondary | ICD-10-CM | POA: Insufficient documentation

## 2015-04-03 DIAGNOSIS — Z7984 Long term (current) use of oral hypoglycemic drugs: Secondary | ICD-10-CM | POA: Insufficient documentation

## 2015-04-03 DIAGNOSIS — E11649 Type 2 diabetes mellitus with hypoglycemia without coma: Secondary | ICD-10-CM | POA: Insufficient documentation

## 2015-04-03 DIAGNOSIS — N359 Urethral stricture, unspecified: Secondary | ICD-10-CM | POA: Diagnosis not present

## 2015-04-03 DIAGNOSIS — F0390 Unspecified dementia without behavioral disturbance: Secondary | ICD-10-CM | POA: Insufficient documentation

## 2015-04-03 DIAGNOSIS — M549 Dorsalgia, unspecified: Secondary | ICD-10-CM

## 2015-04-03 HISTORY — DX: Calculus of kidney: N20.0

## 2015-04-03 HISTORY — DX: Other retention of urine: R33.8

## 2015-04-03 HISTORY — DX: Malignant neoplasm of prostate: C61

## 2015-04-03 LAB — CBC WITH DIFFERENTIAL/PLATELET
BASOS ABS: 0 10*3/uL (ref 0.0–0.1)
BASOS PCT: 0 %
Eosinophils Absolute: 0 10*3/uL (ref 0.0–0.7)
Eosinophils Relative: 0 %
HEMATOCRIT: 31.9 % — AB (ref 39.0–52.0)
Hemoglobin: 10.2 g/dL — ABNORMAL LOW (ref 13.0–17.0)
LYMPHS PCT: 8 %
Lymphs Abs: 0.9 10*3/uL (ref 0.7–4.0)
MCH: 28.7 pg (ref 26.0–34.0)
MCHC: 32 g/dL (ref 30.0–36.0)
MCV: 89.6 fL (ref 78.0–100.0)
MONO ABS: 0.9 10*3/uL (ref 0.1–1.0)
Monocytes Relative: 8 %
NEUTROS PCT: 84 %
Neutro Abs: 9.2 10*3/uL — ABNORMAL HIGH (ref 1.7–7.7)
Platelets: 515 10*3/uL — ABNORMAL HIGH (ref 150–400)
RBC: 3.56 MIL/uL — AB (ref 4.22–5.81)
RDW: 15.9 % — AB (ref 11.5–15.5)
WBC: 11 10*3/uL — AB (ref 4.0–10.5)

## 2015-04-03 LAB — COMPREHENSIVE METABOLIC PANEL
ALBUMIN: 2.6 g/dL — AB (ref 3.5–5.0)
ALT: 18 U/L (ref 17–63)
AST: 29 U/L (ref 15–41)
Alkaline Phosphatase: 186 U/L — ABNORMAL HIGH (ref 38–126)
Anion gap: 8 (ref 5–15)
BILIRUBIN TOTAL: 0.7 mg/dL (ref 0.3–1.2)
BUN: 31 mg/dL — AB (ref 6–20)
CHLORIDE: 100 mmol/L — AB (ref 101–111)
CO2: 30 mmol/L (ref 22–32)
CREATININE: 1.08 mg/dL (ref 0.61–1.24)
Calcium: 8.3 mg/dL — ABNORMAL LOW (ref 8.9–10.3)
GFR calc Af Amer: >60 mL/min (ref >60)
GLUCOSE: 177 mg/dL — AB (ref 65–99)
POTASSIUM: 3.4 mmol/L — AB (ref 3.5–5.1)
Sodium: 138 mmol/L (ref 135–145)
TOTAL PROTEIN: 6.9 g/dL (ref 6.5–8.1)

## 2015-04-03 LAB — URINALYSIS, ROUTINE W REFLEX MICROSCOPIC
GLUCOSE, UA: NEGATIVE mg/dL
Leukocytes, UA: NEGATIVE
Nitrite: NEGATIVE
SPECIFIC GRAVITY, URINE: 1.02 (ref 1.005–1.030)
pH: 5.5 (ref 5.0–8.0)

## 2015-04-03 LAB — URINE MICROSCOPIC-ADD ON
BACTERIA UA: NONE SEEN
Squamous Epithelial / LPF: NONE SEEN
WBC UA: NONE SEEN WBC/hpf (ref 0–5)

## 2015-04-03 LAB — CBG MONITORING, ED
Glucose-Capillary: 57 mg/dL — ABNORMAL LOW (ref 65–99)
Glucose-Capillary: 166 mg/dL — ABNORMAL HIGH (ref 65–99)
Glucose-Capillary: 130 mg/dL — ABNORMAL HIGH (ref 65–99)
Glucose-Capillary: 171 mg/dL — ABNORMAL HIGH (ref 65–99)
Glucose-Capillary: 143 mg/dL — ABNORMAL HIGH (ref 65–99)

## 2015-04-03 MED ORDER — FENTANYL CITRATE (PF) 100 MCG/2ML IJ SOLN
50.0000 ug | Freq: Once | INTRAMUSCULAR | Status: AC
  Administered 2015-04-03: 50 ug via INTRAVENOUS
  Filled 2015-04-03: qty 2

## 2015-04-03 MED ORDER — SODIUM CHLORIDE 0.9 % IV BOLUS (SEPSIS): 500.0000 mL | Freq: Once | INTRAVENOUS | Status: DC

## 2015-04-03 MED ORDER — LIDOCAINE HCL 2 % EX GEL
1.0000 "application " | Freq: Once | CUTANEOUS | Status: AC
  Administered 2015-04-03: 1 via URETHRAL
  Filled 2015-04-03: qty 10

## 2015-04-03 MED ORDER — SODIUM CHLORIDE 0.9 % IV BOLUS (SEPSIS)
1000.0000 mL | Freq: Once | INTRAVENOUS | Status: AC
  Administered 2015-04-03: 1000 mL via INTRAVENOUS

## 2015-04-03 MED ORDER — DEXTROSE 50 % IV SOLN
INTRAVENOUS | Status: AC
  Administered 2015-04-03: 25 g
  Filled 2015-04-03: qty 50

## 2015-04-03 MED ORDER — HYOSCYAMINE SULFATE 0.125 MG PO TABS
0.1250 mg | ORAL_TABLET | Freq: Once | ORAL | Status: AC
  Administered 2015-04-03: 0.125 mg via ORAL
  Filled 2015-04-03: qty 1

## 2015-04-03 MED ORDER — DEXTROSE 5 % IV SOLN
2.0000 g | Freq: Once | INTRAVENOUS | Status: AC
  Administered 2015-04-03: 2 g via INTRAVENOUS
  Filled 2015-04-03: qty 2

## 2015-04-03 MED ORDER — ONDANSETRON HCL 4 MG/2ML IJ SOLN
4.0000 mg | Freq: Once | INTRAMUSCULAR | Status: AC
  Administered 2015-04-03: 4 mg via INTRAVENOUS
  Filled 2015-04-03: qty 2

## 2015-04-03 MED ORDER — SODIUM CHLORIDE 0.9 % IV SOLN: INTRAVENOUS | Status: DC

## 2015-04-03 NOTE — ED Notes (Signed)
Dr Leonides Schanz notified of > 269 ml of urine in bladder per bladder scan.

## 2015-04-03 NOTE — ED Notes (Signed)
Attempted to insert Coude' without success.  Dr Leonides Schanz informed.  See new orders.

## 2015-04-03 NOTE — ED Notes (Signed)
Pt arrived by EMS from [facility] w/ urinary retention.  CBG was 25 was given 1/2 of d50 by ems cbg went to 102

## 2015-04-03 NOTE — ED Notes (Signed)
Attempted to pass 18 [place] Coude Catheter after previous attempt with 16 [place] Coude by prior shift Therapist, sports. Prior to attempt, small amount of blood noted at meatus from previous attempt. Curling of catheter noted when attempting to pass prostate. Guidance techniques attempted without success. MD notified. No Blood noted to catheter when removed.

## 2015-04-03 NOTE — ED Notes (Signed)
Attempted to insert foley, resistance met & unable to insert. AC called to bring urology cart.

## 2015-04-03 NOTE — ED Notes (Signed)
Spoke with nurse, Elmyra Ricks at [facility], report given, including needed follow up with [facility] in one week and that his catheter changes will be done there, once monthly. No questions on her part. She stated that their transport personal is out at the moment but she will have them call me when they return.

## 2015-04-03 NOTE — Consult Note (Signed)
Subjective: Mr. Prizzi is a 74 yo WM with a history of metastatic prostate cancer and prior retention who was sent in from the [facility] for reduced urine output.  He was found to have a PVR of 251ml on [place] and an attempt by the nursing staff to place the foley was unsuccessful.   He is unable to provide a history but a CT in September showed an enlarged irregular prostate with bone mets and adenopathy.  He had a prior TURP by Dr. Michela Pitcher in around 2012.  He was found to have renal stones on CT in September but had no hydro today.   ROS:  Review of Systems  Unable to perform ROS: dementia    No Known Allergies  Past Medical History  Diagnosis Date  . Hypertension   . Constipation   . Dementia   . GERD (gastroesophageal reflux disease)   . Coronary artery disease   . GAD (generalized anxiety disorder)   . Diabetes mellitus without complication ([place])   . Depressive disorder   . Prostate cancer metastatic to multiple sites [facility])   . Acute urinary retention   . Nephrolithiasis     Past Surgical History  Procedure Laterality Date  . Transurethral resection of prostate      Social History   Social History  . Marital Status: Single    Spouse Name: N/A  . Number of Children: N/A  . Years of Education: N/A   Occupational History  . Not on file.   Social History Main Topics  . Smoking status: Unknown If Ever Smoked  . Smokeless tobacco: Not on file  . Alcohol Use: No  . Drug Use: No  . Sexual Activity: Not on file   Other Topics Concern  . Not on file   Social History Narrative    No family history on file.  Anti-infectives: Anti-infectives    None      Current Facility-Administered Medications  Medication Dose Route Frequency Provider Last Rate Last Dose  . 0.9 %  sodium chloride infusion   Intravenous Continuous Kristen N Ward, DO   Stopped at 04/03/15 0750  . fentaNYL (SUBLIMAZE) injection 50 mcg  50 mcg Intravenous Once Sara Lee, MD      . sodium  chloride 0.9 % bolus 500 mL  500 mL Intravenous Once Merrily Pew, MD       Current Outpatient Prescriptions  Medication Sig Dispense Refill  . acetaminophen (TYLENOL) 325 MG tablet Take 325 mg by mouth 2 (two) times daily.    Marland Kitchen alum & mag hydroxide-simeth (MYLANTA) [id number] MG/5ML suspension Take 30 mLs by mouth every 6 (six) hours as needed for indigestion or heartburn.    Marland Kitchen amLODipine (NORVASC) 5 MG tablet Take 5 mg by mouth daily.    Marland Kitchen aspirin EC 81 MG tablet Take 81 mg by mouth daily.    . benazepril (LOTENSIN) 20 MG tablet Take 40 mg by mouth daily.    . chlorthalidone (HYGROTON) 25 MG tablet Take 25 mg by mouth daily.    Marland Kitchen docusate sodium (COLACE) 100 MG capsule Take 100 mg by mouth 2 (two) times daily.    Marland Kitchen dronabinol (MARINOL) 2.5 MG capsule Take 2.5 mg by mouth 2 (two) times daily before a meal.    . LEVEMIR FLEXTOUCH 100 UNIT/ML Pen Inject 50 Units into the skin daily. Am    . metFORMIN (GLUCOPHAGE) 500 MG tablet Take 500 mg by mouth daily.    . metoprolol succinate (TOPROL-XL)  25 MG 24 hr tablet Take 12.5 mg by mouth daily.    . nitroGLYCERIN (NITROSTAT) 0.4 MG SL tablet Place 0.4 mg under the tongue every 5 (five) minutes as needed for chest pain.    Marland Kitchen NOVOLOG FLEXPEN 100 UNIT/ML FlexPen Inject 2-10 Units into the skin daily. 150-200=2units 201-250=4units 251-300=6units 301-350=8units Greater than 350 give 10 units then call MD    . senna-docusate (SENOKOT-S) 8.6-50 MG tablet Take 2 tablets by mouth at bedtime.    . sertraline (ZOLOFT) 25 MG tablet Take 25 mg by mouth daily.    . traMADol (ULTRAM) 50 MG tablet Take 50 mg by mouth every 6 (six) hours as needed for moderate pain.       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  A/O x 0.   Cardiovascular: Normal rate and regular rhythm.   Pulmonary/Chest: Effort normal. No respiratory distress.  Abdominal: Soft. He exhibits mass (suprapubic).  Genitourinary:  Circumcised phallus with an adequate meatus.   Normal scrotum and contents.    Rectal exam deferred.   Musculoskeletal: Normal range of motion. He exhibits no edema or tenderness.  Skin: Skin is warm and dry.    Lab Results:   Recent Labs  04/03/15 0703  WBC 11.0*  HGB 10.2*  HCT 31.9*  PLT 515*   BMET  Recent Labs  04/03/15 0703  NA 138  K 3.4*  CL 100*  CO2 30  GLUCOSE 177*  BUN 31*  CREATININE 1.08  CALCIUM 8.3*   PT/INR No results for input(s): LABPROT, INR in the last 72 hours. ABG No results for input(s): PHART, HCO3 in the last 72 hours.  Invalid input(s): PCO2, PO2  Studies/Results: Dg Chest 1 View  04/03/2015  CLINICAL DATA:  Upper and lower back pain, no mention of injury. EXAM: CHEST 1 VIEW COMPARISON:  Portable chest x-ray of April 30, 2010 FINDINGS: The lungs are mildly hypoinflated. The interstitial markings increased on the left. There is no alveolar infiltrate. There is no pleural effusion. The heart and pulmonary vascularity are normal. The mediastinum is normal in width. There is chronic deformity of the proximal shaft of the right humerus. The observed portions of the thoracic spine exhibit no acute abnormalities. IMPRESSION: Stable chronic bilateral hypoinflation. Mildly increased interstitial markings on the left. No alveolar pneumonia nor CHF. Electronically Signed   By: David  Martinique M.D.   On: 04/03/2015 07:48   Dg Lumbar Spine Complete  04/03/2015  CLINICAL DATA:  Upper and lower back pain common no report of injury EXAM: LUMBAR SPINE - COMPLETE 4+ VIEW COMPARISON:  CT scan of the abdomen and  pelvis of January 07, 2015. FINDINGS: The lumbar vertebral bodies are preserved in height. There is moderate disc space narrowing at L4-5. There is no spondylolisthesis. No pars defects are observed. The pedicles and transverse processes are intact. The observed portions of the sacrum are normal. IMPRESSION: Chronic disc space narrowing at L4-5 consistent with degenerative change. There is no compression fracture nor other acute bony abnormality. Electronically Signed   By: David  Martinique  M.D.   On: 04/03/2015 07:46   US Renal  04/03/2015  CLINICAL DATA:  Urinary retention.  History of prostate carcinoma EXAM: RENAL / URINARY TRACT ULTRASOUND COMPLETE COMPARISON:  November 04, 2005 FINDINGS: Right Kidney: Length: 10.0 cm. Echogenicity and renal cortical thickness are within normal limits. No mass, perinephric fluid, or hydronephrosis visualized. Echogenic foci of in the right kidney probably represent arcuate artery imaging on hand. No calculi or ureterectasis are appreciable. Left Kidney: Length: 11.1 cm. Echogenicity and renal cortical thickness are within normal limits. No perinephric fluid or hydronephrosis visualized. There is a cyst in the lower pole region measuring 4.9 x 4.3 x 3.9 cm. A second cyst measures 2.0 x 1.8 x 2.5 cm in the lower pole region. The cysts appear to be stable Bosniak II cysts. No new masses appreciable. No sonographically demonstrable calculus or ureterectasis. Bladder: Appears normal for degree of bladder distention. The urinary bladder is somewhat distended with a prevoid volume of 279 mL. Patient was unable to void. The prostate is enlarged with heterogeneous echotexture. Prostate measures 6.3 x 3.8 x 6.8 cm. 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 No new renal masses. No hydronephrosis on either side. Enlarged prostate with inhomogeneous echotexture, impressing on the inferior bladder. Urinary bladder distended with prevoid volume of 279 mL.  Patient unable to void. Electronically Signed   By: Lowella Grip III M.D.   On: 04/03/2015 09:02   Procedure:  Cystoscopy with urethral dilation and foley placement.  Note dictated.  Assessment: He had a proximal urethral/prostatic stricture with a stone in the lumen.  I was able to dilate the urethra and place an 34fr council catheter.  Which will need to be changed on a monthly basis.   Urine culture ordered.  He was given rocephin 2gm IV to cover the procedure.     CC: Dr. Cyril Mourning Ward     Irine Seal J 04/03/2015 [phone]

## 2015-04-03 NOTE — ED Notes (Signed)
Assisted Dr Jeffie Pollock with cystoscopy and foley placement. Patient tolerated procedure fairly well.

## 2015-04-03 NOTE — ED Notes (Signed)
Spoke with Claiborne Billings, transport from [facility], stated she is on the way to pick up the patient, ETA 20 mins

## 2015-04-03 NOTE — Op Note (Signed)
NAMEDAXTER, STUBBINS NO.:  [id number]  MEDICAL RECORD NO.:  [id number]  LOCATION:  [id number]                         FACILITY:  [facility]  PHYSICIAN:  Marshall Cork. Jeffie Pollock, M.D.    DATE OF BIRTH:  [birth year]  DATE OF PROCEDURE:  04/03/2015 DATE OF DISCHARGE:                              OPERATIVE REPORT   PATIENT OF:  Dr. Cyril Mourning Ward.  PROCEDURE:  Cystoscopy with urethral balloon dilation and insertion of Foley catheter.  PREOPERATIVE DIAGNOSIS:  Urinary retention.  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  SURGEON:  Marshall Cork. Jeffie Pollock, M.D.  ANESTHESIA:  Local.  DRAINS:  An 18-French Council catheter.  COMPLICATIONS:  None.  INDICATIONS:  Mr. Bak is a 74 year old African American male, who was brought in from the [facility] for decreased urine output.  He was found on ultrasound to have no hydro, but 280 mL in the bladder.  He had previously been in retention in September.  He has a history of metastatic prostate cancer and has had a prior TURP by Dr. Viann Shove. Nursing attempts to place the catheter were unsuccessful.  FINDINGS OF PROCEDURE:  He was in the ER stretcher and was left in a supine position.  He was prepped with Betadine solution and draped with a sterile towel.  An attempt was made to pass an 18-French coude after urethral lubrication, but that was unsuccessful due to obstruction and what felt to be near the prostate.  At this point, a 17-French flexible cystoscope was inserted per urethra. Inspection revealed a stricture, it was difficult to locate exactly, but was either in the bulb or in the distal prostate, most likely the bulb since I did not appreciate the presence of the sphincter.  There appeared to be about an 8-French lumen with calcification in the lumen.  At this point, a Sensor guidewire was passed into the bladder through the lumen without difficulty and the cystoscope was removed.  A  24- French 15 cm urethral balloon dilation catheter was inserted over the wire and was inflated to 20 atmospheres, then deflated and removed.  An 18-French Council catheter was then inserted over the catheter into the bladder without difficulty and the balloon was filled with 10 mL of sterile fluid.  The wire was removed and the catheter then drained some bloody turbid urine which was collected to send for culture.  The catheter was placed to straight drainage.  The patient was given Rocephin 2 g IV for antibiotic coverage for possible UTI, and the urine will be sent for culture.  There were no complications during my procedure.     Marshall Cork. Jeffie Pollock, M.D.     JJW/MEDQ  D:  04/03/2015  T:  04/03/2015  Job:  [id number]

## 2015-04-03 NOTE — ED Provider Notes (Signed)
TIME SEEN: 6:10 AM  CHIEF COMPLAINT: Urinary retention, hypoglycemia  HPI: Pt is a 74 y.o. male with history of dementia, hypertension, insulin-dependent diabetes, prostate cancer a prior episode of urinary retention who lives at [facility] and nursing facility who presents to the emergency department with concerns for no urinary output in the past 24 hours and concerns for urinary retention. Has previously had urinary retention but no longer has a Foley catheter in place. They attempted to catheterize patient in the nursing facility unsuccessfully. EMS found patient's blood glucose to be 25. Was given half an amp of D50 and blood glucose was 102. Repeat here was 57. History is very limited as patient is demented. He reports he is having back pain but unable to tell me how long this is been present. Has a previous history of a stroke with right-sided hemiparesis at baseline. He denies any pain anywhere else. He does state that he feels nauseous. No known history of vomiting or diarrhea.  ROS: Level V caveat for dementia  PAST MEDICAL HISTORY/PAST SURGICAL HISTORY:  Past Medical History  Diagnosis Date  . Hypertension   . Constipation   . Dementia   . GERD (gastroesophageal reflux disease)   . Coronary artery disease   . GAD (generalized anxiety disorder)   . Diabetes mellitus without complication ([place])   . Depressive disorder     MEDICATIONS:  Prior to Admission medications   Medication Sig Start Date End Date Taking? Authorizing Provider  amLODipine (NORVASC) 5 MG tablet Take 5 mg by mouth daily. 12/16/14  Yes Historical Provider, MD  aspirin EC 81 MG tablet Take 81 mg by mouth daily.   Yes Historical Provider, MD  atorvastatin (LIPITOR) 10 MG tablet Take 10 mg by mouth daily. 12/23/14  Yes Historical Provider, MD  benazepril (LOTENSIN) 20 MG tablet Take 40 mg by mouth daily. 12/02/14  Yes Historical Provider, MD  chlorthalidone (HYGROTON) 25 MG tablet Take 25 mg by mouth daily. 01/03/15   Yes Historical Provider, MD  cloNIDine (CATAPRES) 0.1 MG tablet Take 0.1 mg by mouth 2 (two) times daily. 01/03/15  Yes Historical Provider, MD  docusate sodium (COLACE) 100 MG capsule Take 100 mg by mouth 2 (two) times daily.   Yes Historical Provider, MD  LEVEMIR FLEXTOUCH 100 UNIT/ML Pen Inject 50 Units into the skin daily. Am 01/03/15  Yes Historical Provider, MD  loxapine (LOXITANE) 5 MG capsule Take 5 mg by mouth 2 (two) times daily. 12/31/14  Yes Historical Provider, MD  metFORMIN (GLUCOPHAGE) 500 MG tablet Take 500 mg by mouth daily. 12/28/14  Yes Historical Provider, MD  metoprolol succinate (TOPROL-XL) 25 MG 24 hr tablet Take 12.5 mg by mouth daily. 01/07/15  Yes Historical Provider, MD  nitroGLYCERIN (NITROSTAT) 0.4 MG SL tablet Place 0.4 mg under the tongue every 5 (five) minutes as needed for chest pain.   Yes Historical Provider, MD  NOVOLOG FLEXPEN 100 UNIT/ML FlexPen Inject 2-10 Units into the skin daily. 150-200=2units 201-250=4units 251-300=6units 301-350=8units Greater than 350 give 10 units then call MD 12/18/14  Yes Historical Provider, MD  senna-docusate (SENOKOT-S) 8.6-50 MG tablet Take 2 tablets by mouth at bedtime.   Yes Historical Provider, MD  sertraline (ZOLOFT) 100 MG tablet Take 150 mg by mouth daily. 12/14/14  Yes Historical Provider, MD    ALLERGIES:  No Known Allergies  SOCIAL HISTORY:  Social History  Substance Use Topics  . Smoking status: Unknown If Ever Smoked  . Smokeless tobacco: Not on file  .  Alcohol Use: No    FAMILY HISTORY: No family history on file.  EXAM: BP 127/73 mmHg  Pulse 75  Temp(Src) 98.2 F (36.8 C) (Rectal)  Resp 22  SpO2 95% CONSTITUTIONAL: Alert and oriented person only and responds appropriately to some questioning. We'll follow commands. Elderly, in no distress, afebrile. Rectal temperature is 98.2. Appears comfortable. HEAD: Normocephalic EYES: Conjunctivae clear, PERRL ENT: normal nose; no rhinorrhea; dry mucous membranes;  pharynx without lesions noted NECK: Supple, no meningismus, no LAD  CARD: RRR; S1 and S2 appreciated; no murmurs, no clicks, no rubs, no gallops RESP: Normal chest excursion without splinting or tachypnea; breath sounds clear and equal bilaterally; no wheezes, no rhonchi, no rales, no hypoxia or respiratory distress, speaking full sentences ABD/GI: Normal bowel sounds; non-distended; soft, mildly tender in the suprapubic region, no rebound, no guarding, no peritoneal signs GU:  Normal external genitalia, circumcised male, normal penile shaft, small amount of blood at the urethral meatus, no discharge at the urethral meatus, no testicular masses or tenderness on exam, no scrotal masses or swelling, no hernias appreciated, 2+ femoral pulses bilaterally; no perineal erythema, warmth, subcutaneous air or crepitus; no high riding testicle, normal bilateral cremasteric reflex BACK:  The back appears normal and is non-tender to palpation, there is no CVA tenderness, no midline spinal tenderness or step-off or deformity, no erythema or warmth, no other lesions EXT: Normal ROM in all joints; non-tender to palpation; no edema; normal capillary refill; no cyanosis, no calf tenderness or swelling    SKIN: Normal color for age and race; warm NEURO: Right-sided hemiparesis which is baseline, able to move the left upper and lower extremity without difficulty, no obvious facial droop or dysarthria   MEDICAL DECISION MAKING: Patient here with decreased urinary output, concern for urinary retention which she has had in the past secondary to prostate cancer. Does not have a Foley catheter in place. We'll place a Foley catheter. He is also hypoglycemic. Given another amp of D50 in the emergency department. We'll continue to monitor his blood sugar. Patient given orange juice. Will obtain labs, urine, EKG and chest x-ray to evaluate for any organic cause for his hypoglycemia including infection. He appears to be at his  neurologic baseline. Will obtain a x-ray of his lumbar spine given he is complaining of pain in this area.  We'll give IV fluids given he does appear dry on exam.    ED PROGRESS: 6:40 AM  Pt's blood glucose improved to the 160s.   8:00 AM  patient's labs show mild leukocytosis of 11,000 with left shift. Creatinine is normal. Electrolytes within normal limits as well. Chest x-ray shows no infiltrate, metastasis. Lumbar spine x-ray shows chronic disc space narrowing with no compression fracture or other acute bony abnormality.  Multiple members of nursing staff unable to pass coude catheter.  It appears per prior CT scan in September patient had a prostatic mass, irregular prostate with lymphadenopathy and sclerotic lesions throughout his axial skeleton concerning for metastasis. It is unclear if patient ever followed up with urology. He is unable to provide this information given he has dementia. There is no note from Urology in [place].  Discussed with Dr. Junious Silk with urology. Patient's bladder scan shows 300 mL of urine in the bladder but suspect that there may be more. I think patient does need a catheter.  Dr. Junious Silk will see if there is someone in the urology office that can come to the emergency department to see the patient or he  will come see the patient at [facility] himself. He requested that we obtain a formal ultrasound of his bladder. We'll also evaluate his kidneys as well with this ultrasound.   Patient's repeat blood glucose is in the 130s. He is eating and drinking without difficulty. We'll continue to monitor this. He is not on a sulfonylurea area he is on insulin and metformin.     I feel once patient has had a catheter placed and he can be discharged back to his nursing facility.   EKG Interpretation  Date/Time:  Friday April 03 2015 06:38:16 EST Ventricular Rate:  78 PR Interval:  219 QRS Duration: 89 QT Interval:  382 QTC Calculation: 435 R Axis:   57 Text  Interpretation:  Sinus rhythm Borderline prolonged PR interval Borderline low voltage, extremity leads Anteroseptal infarct, old No significant change since last tracing other than rate is slower Confirmed by Hendrixx Severin,  DO, Mong Neal [phone]) on 04/03/2015 6:41:51 AM         [place], DO 04/03/15 [id number]

## 2015-04-03 NOTE — ED Notes (Signed)
Patient left at this time with [facility] personal.

## 2015-04-05 LAB — URINE CULTURE

## 2015-12-11 DEATH — deceased

## 2016-01-11 IMAGING — US US RENAL
1 series · 13 of 25 positions shown · non-contrast
Comparison: November 04, 2005

CLINICAL DATA: Urinary retention.  History of prostate carcinoma

EXAM:
RENAL / URINARY TRACT ULTRASOUND COMPLETE

[Series 1: us renal · 0.20mm/px · 13 of 78 slices shown]
[im 1/78]
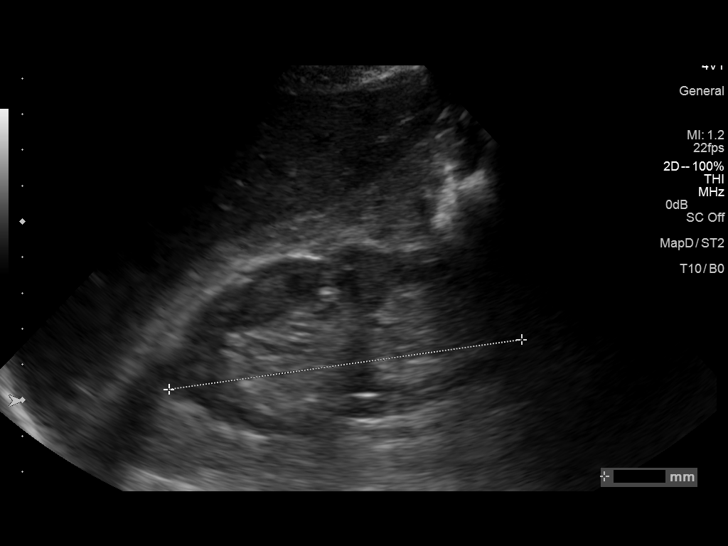
[im 7/78]
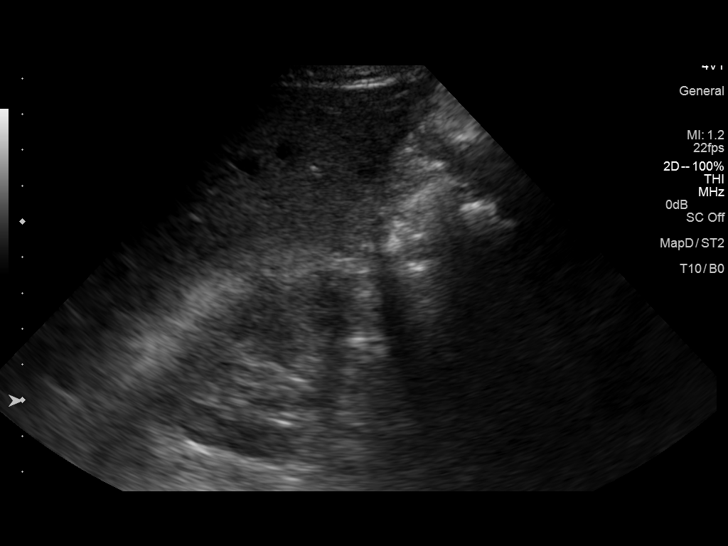
[im 13/78]
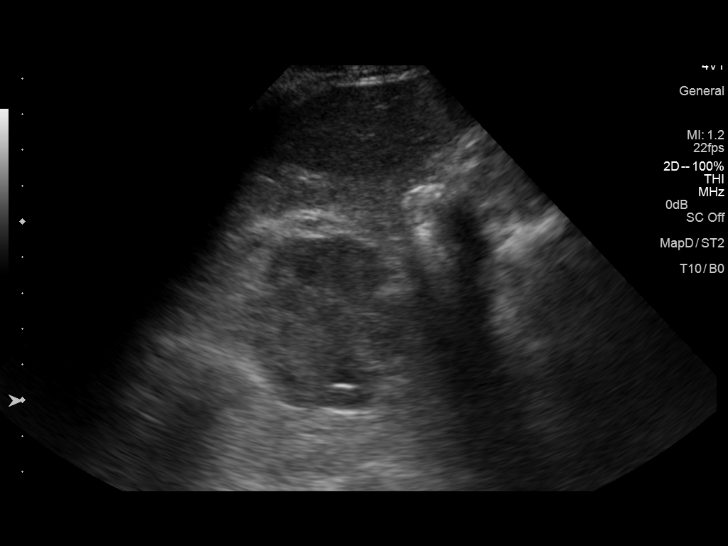
[im 20/78]
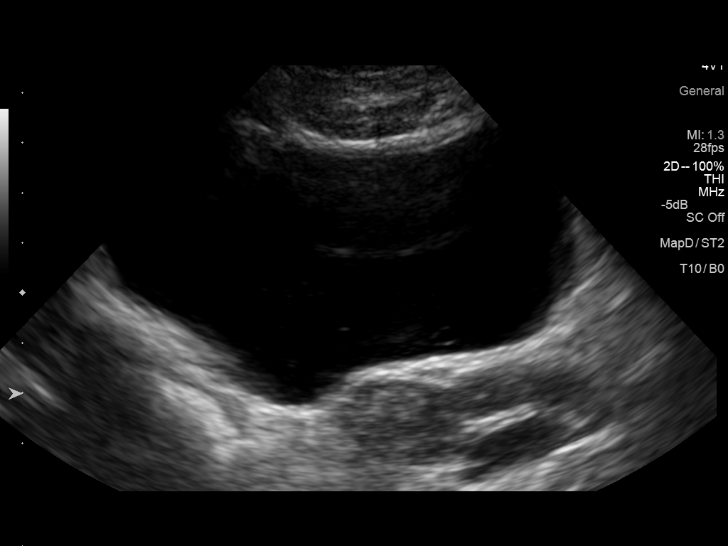
[im 26/78]
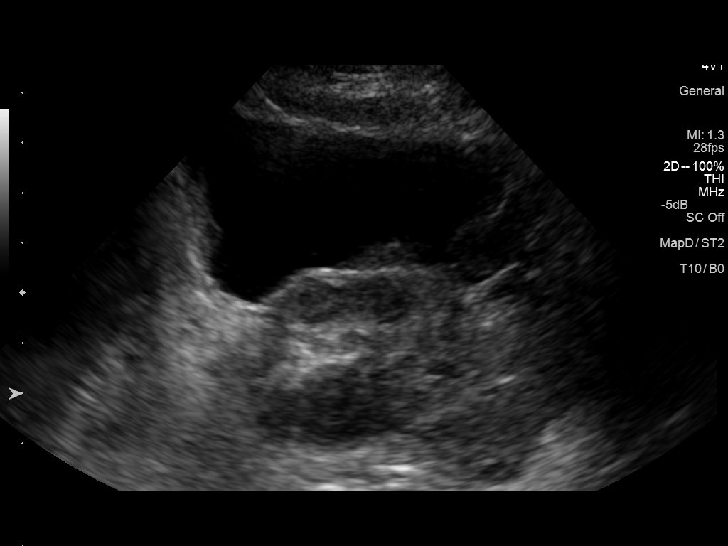
[im 33/78]
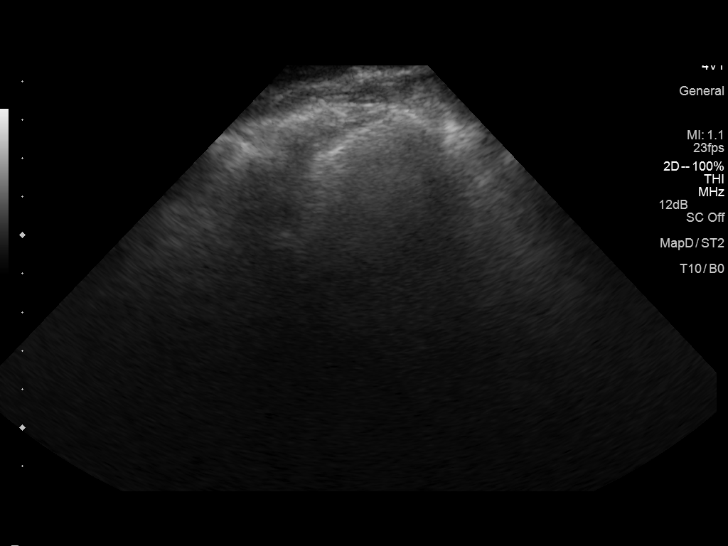
[im 39/78]
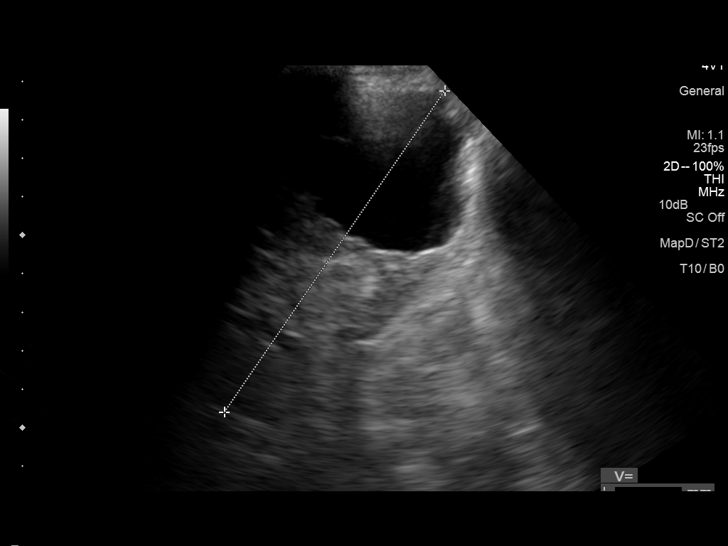
[im 45/78]
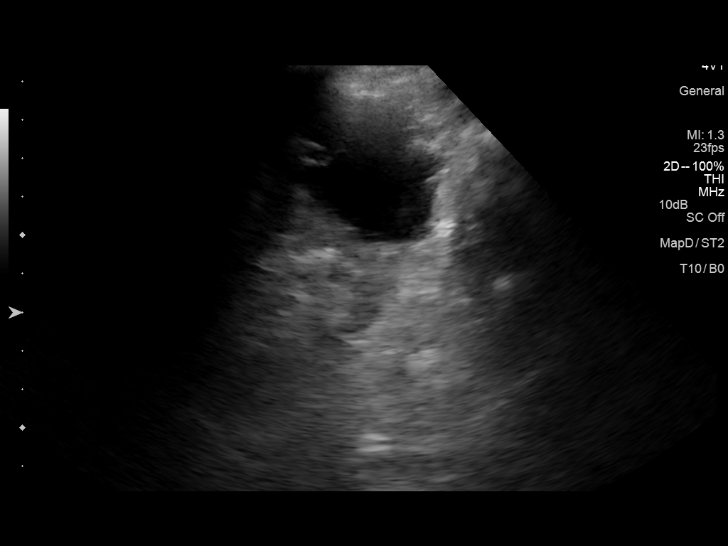
[im 52/78]
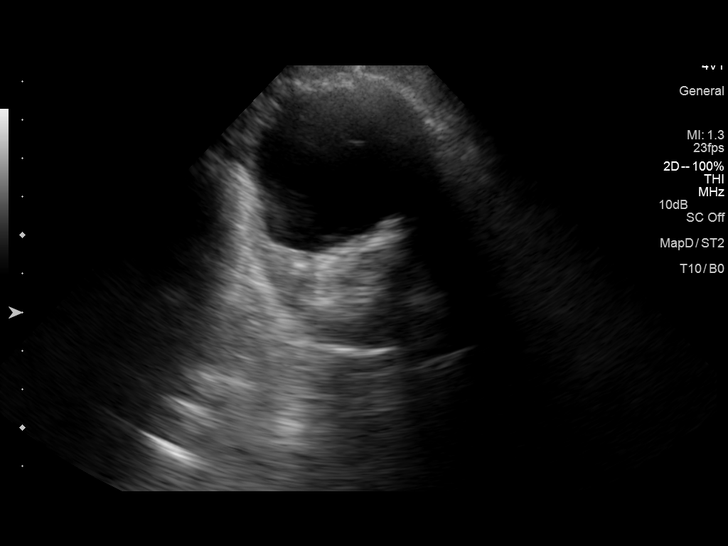
[im 58/78]
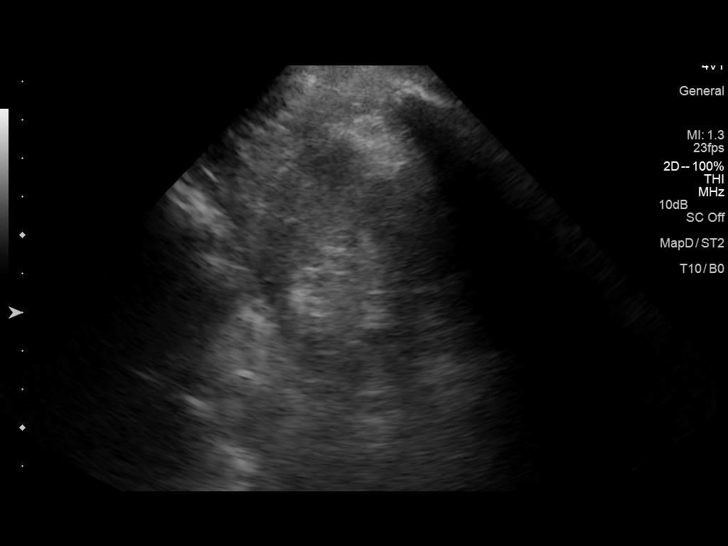
[im 65/78]
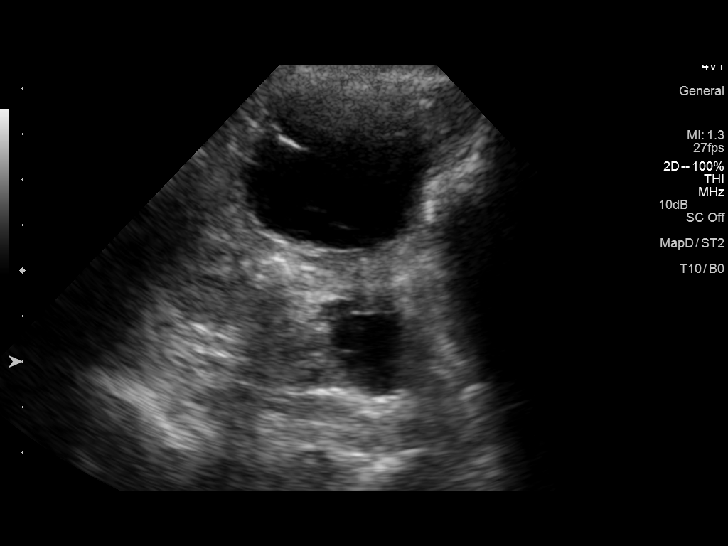
[im 71/78]
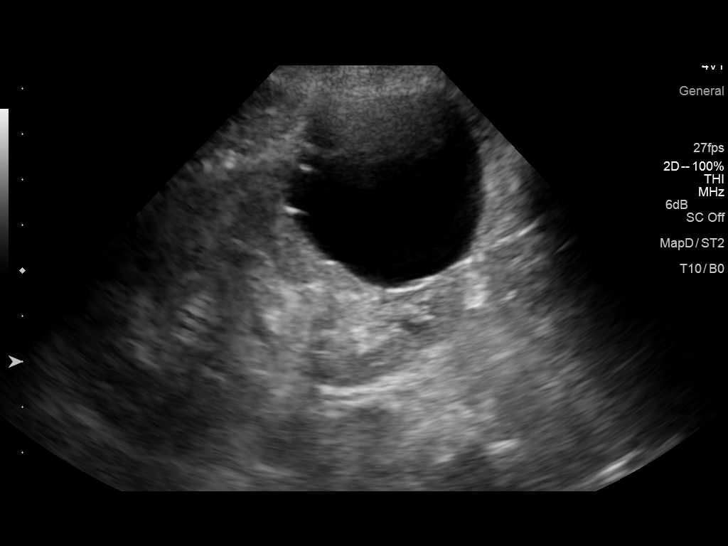
[im 78/78]
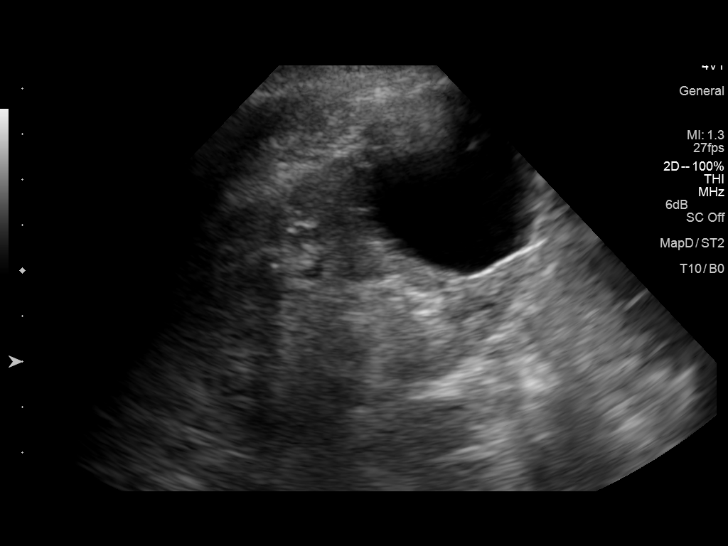

[13 of 25 positions shown; findings below may reference images not displayed]

FINDINGS: Right Kidney:

Length: 10.0 cm. Echogenicity and renal cortical thickness are
within normal limits. No mass, perinephric fluid, or hydronephrosis
visualized. Echogenic foci of in the right kidney probably represent
arcuate artery imaging on hand. No calculi or ureterectasis are
appreciable.

Left Kidney:

Length: 11.1 cm. Echogenicity and renal cortical thickness are
within normal limits. No perinephric fluid or hydronephrosis
visualized. There is a cyst in the lower pole region measuring 4.9 x
4.3 x 3.9 cm. A second cyst measures 2.0 x 1.8 x 2.5 cm in the lower
pole region. The cysts appear to be stable Bosniak II cysts. No new
masses appreciable. No sonographically demonstrable calculus or
ureterectasis.

Bladder:

Appears normal for degree of bladder distention. The urinary bladder
is somewhat distended with a prevoid volume of 279 mL. Patient was
unable to void.

The prostate is enlarged with heterogeneous echotexture. Prostate
measures 6.3 x 3.8 x 6.8 cm.
IMPRESSION: Stable Bosniak II cysts arising from the lower pole left kidney. No
new renal masses. No hydronephrosis on either side.

Enlarged prostate with inhomogeneous echotexture, impressing on the
inferior bladder.

Urinary bladder distended with prevoid volume of 279 mL. Patient
unable to void.
